# Patient Record
Sex: Male | Born: 1975 | Race: Black or African American | Hispanic: No | Marital: Single | Smoking: Never smoker
Health system: Southern US, Community
[De-identification: ages and names within clinical notes are randomized; demographics above are authoritative.]

## PROBLEM LIST (undated history)

## (undated) DIAGNOSIS — R002 Palpitations: Secondary | ICD-10-CM

## (undated) DIAGNOSIS — R7989 Other specified abnormal findings of blood chemistry: Secondary | ICD-10-CM

## (undated) DIAGNOSIS — G4730 Sleep apnea, unspecified: Secondary | ICD-10-CM

## (undated) DIAGNOSIS — E7800 Pure hypercholesterolemia, unspecified: Secondary | ICD-10-CM

## (undated) DIAGNOSIS — I10 Essential (primary) hypertension: Secondary | ICD-10-CM

## (undated) HISTORY — DX: Sleep apnea, unspecified: G47.30

## (undated) HISTORY — DX: Essential (primary) hypertension: I10

---

## 2004-10-09 ENCOUNTER — Emergency Department (HOSPITAL_COMMUNITY): Admission: EM | Admit: 2004-10-09 | Discharge: 2004-10-09 | Payer: Self-pay | Admitting: *Deleted

## 2006-06-07 IMAGING — CR DG CHEST 1V PORT
1 series · 1 of 1 positions shown · non-contrast
Comparison: None.

CLINICAL DATA: Chest pain/shortness of breath.
 PORTABLE GNFQS-W VIEW:

[view not recorded]
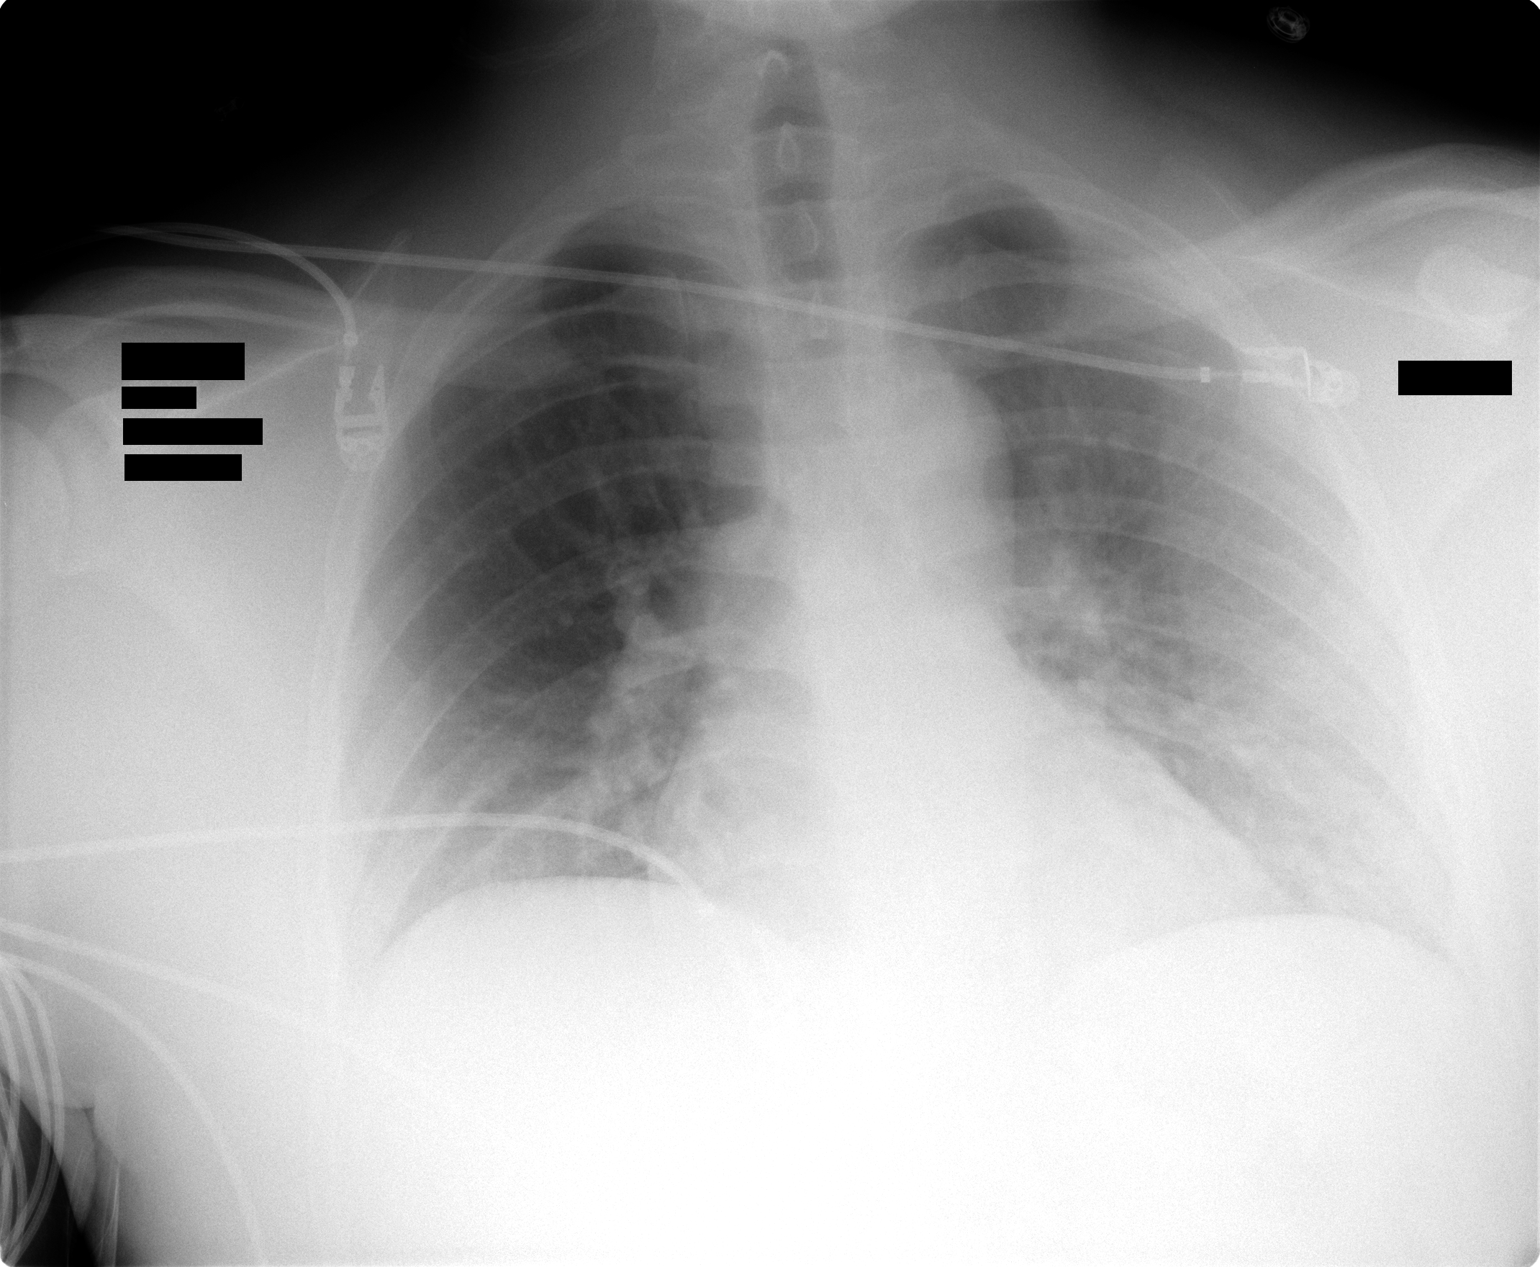

[1 of 1 positions shown; findings below may reference images not displayed]

FINDINGS: Relative low level of inspiration.  Heart and lungs within normal limits considering. No osseous lesions in one view.
IMPRESSION: Suboptimal inspiration ? no active disease.

## 2014-07-19 ENCOUNTER — Encounter: Payer: Self-pay | Admitting: Dietician

## 2014-07-19 ENCOUNTER — Encounter: Payer: 59 | Attending: Internal Medicine | Admitting: Dietician

## 2014-07-19 VITALS — Ht 73.0 in | Wt 349.1 lb

## 2014-07-19 DIAGNOSIS — Z713 Dietary counseling and surveillance: Secondary | ICD-10-CM | POA: Insufficient documentation

## 2014-07-19 DIAGNOSIS — E669 Obesity, unspecified: Secondary | ICD-10-CM | POA: Diagnosis present

## 2014-07-19 DIAGNOSIS — Z6841 Body Mass Index (BMI) 40.0 and over, adult: Secondary | ICD-10-CM | POA: Insufficient documentation

## 2014-07-19 NOTE — Patient Instructions (Addendum)
Aim to eat 3 meals per day and 2 snack if you are hungry. For breakfast have a protein (egg, yogurt, peanut butter, protein powder/shake) with carbohydrate (fruit, bread, oatmeal, cereal). Aim to fill half of your plate with vegetables at lunch and dinner. Limit starch to a quarter of your plate and have protein the size of the palm of your hand. Try using a small plate to help with portions. Have snacks with protein and carbs (see list). Portion out snacks out.  Try Diet V8 Splash or Diet Cranberry juice to help cut back on sugar. Continue your exercise plan.

## 2014-07-19 NOTE — Progress Notes (Signed)
  Medical Nutrition Therapy:  Appt start time: 0815 end time:  0855.   Assessment:  Primary concerns today: Noah Shepherd is here today since he was referred for hypertension. Is interested in learning to eat healthier. Works out regularly. Would like to get weight down to 275 lbs. Has lost weight before by dieting and exercise. Has recently cut out fried foods, eating more vegetables, and only eating chicken and fish for protein for the past 6 weeks. Has lost 12 lbs since then.  Works for [employer] regular business hours from home. Lives by himself and does his own meal preparation. Misses about 7 meals per week. Eats out about 3 x week.  Would like to have help with breakfast meal, adding more vegetables, and what do if he doesn't have time to eat.    Feels like portion sizes "could be better". Sometimes feels hungry but it is rare.   Preferred Learning Style:   No preference indicated   Learning Readiness:   Ready  MEDICATIONS: see list   DIETARY INTAKE:  Usual eating pattern includes 2 meals and 1 snacks per day.  Avoided foods include brussels sprouts  24-hr recall:  B ( AM): skips, might have orange juice, sometimes might have cereal or yogurt Snk ( AM): none  L ( PM): salad with grilled chicken/crab or leftovers Snk ( PM): trail mix or smart pop popcorn D ( PM): chicken, rice, and hibatchi vegetables Snk ( PM): none Beverages: 4-5 8 oz glasses juice, water  Usual physical activity: gym 3 x week - treadmill, bike and weights for 60-90 minutes   Estimated energy needs: 2400 calories 270 g carbohydrates 180 g protein 67 g fat  Progress Towards Goal(s):  In progress.   Nutritional Diagnosis:  [place]-3.3 Overweight/obesity As related to hx of large portion sizes, meal skipping, and consumption of sugar sweetened beverages.  As evidenced by BMI of 46.1.    Intervention:  Nutrition counseling provided. Plan: Aim to eat 3 meals per day and 2 snack if you are  hungry. For breakfast have a protein (egg, yogurt, peanut butter, protein powder/shake) with carbohydrate (fruit, bread, oatmeal, cereal). Aim to fill half of your plate with vegetables at lunch and dinner. Limit starch to a quarter of your plate and have protein the size of the palm of your hand. Try using a small plate to help with portions. Have snacks with protein and carbs (see list). Portion out snacks out.  Try Diet V8 Splash or Diet Cranberry juice to help cut back on sugar. Continue your exercise plan.   Teaching Method Utilized:  Visual Auditory Hands on  Handouts given during visit include:  MyPlate Handout  Yellow Meal Card  15 g CHO Snacks  Protein Shakes  Barriers to learning/adherence to lifestyle change: none  Demonstrated degree of understanding via:  Teach Back   Monitoring/Evaluation:  Dietary intake, exercise, and body weight in 1 month(s).

## 2014-08-29 ENCOUNTER — Encounter: Payer: 59 | Attending: Internal Medicine | Admitting: Dietician

## 2014-08-29 ENCOUNTER — Encounter: Payer: Self-pay | Admitting: Dietician

## 2014-08-29 VITALS — Ht 73.0 in | Wt 343.2 lb

## 2014-08-29 DIAGNOSIS — E669 Obesity, unspecified: Secondary | ICD-10-CM

## 2014-08-29 DIAGNOSIS — Z6841 Body Mass Index (BMI) 40.0 and over, adult: Secondary | ICD-10-CM | POA: Diagnosis not present

## 2014-08-29 DIAGNOSIS — Z713 Dietary counseling and surveillance: Secondary | ICD-10-CM | POA: Insufficient documentation

## 2014-08-29 NOTE — Patient Instructions (Signed)
Continue to eat 3 meals per day and 2 snack if you are hungry. Continue to fill half of your plate with vegetables at lunch and dinner. Continue to limit starch to a quarter of your plate and have protein the size of the palm of your hand. Portion out snacks out or use pre-packaged snacks. Continue your exercise plan.  Working on consistency with your plan. Plan to have dessert 1 x week.

## 2014-08-29 NOTE — Progress Notes (Signed)
  Medical Nutrition Therapy:  Appt start time: 0515 end time:  0530.   Assessment:  Primary concerns today: Noah Shepherd returns with a 6 lbs weight loss. Still feels like he is craving sugar. Drinking diet cranberry juice instead of regular juice. Eating breakfast now. Portion sizes are better and bought smaller plates.   Feeling more hungry, especially in the morning.   Preferred Learning Style:   No preference indicated   Learning Readiness:   Ready  MEDICATIONS: see list   DIETARY INTAKE:  Usual eating pattern includes 2 meals and 1 snacks per day.  Avoided foods include brussels sprouts  24-hr recall:  B ( AM): 2 boiled eggs or cereal/oatmeal Snk ( AM): fruit  L ( PM): salad with grilled chicken/crab or leftovers Snk ( PM): trail mix or smart pop popcorn D ( PM): chicken, rice, and hibatchi vegetables Snk ( PM): none or sweets on weekend nights Beverages: 4-5 8 oz diet cranberry juice glasses juice, water, alcohol sometimes   Usual physical activity: gym 3 x week - treadmill, bike and weights for 60-90 minutes   Estimated energy needs: 2400 calories 270 g carbohydrates 180 g protein 67 g fat  Progress Towards Goal(s):  In progress.   Nutritional Diagnosis:  [place]-3.3 Overweight/obesity As related to hx of large portion sizes, meal skipping, and consumption of sugar sweetened beverages.  As evidenced by BMI of 46.1.    Intervention:  Nutrition counseling provided. Plan: Continue to eat 3 meals per day and 2 snack if you are hungry. Continue to fill half of your plate with vegetables at lunch and dinner. Continue to limit starch to a quarter of your plate and have protein the size of the palm of your hand. Portion out snacks out or use pre-packaged snacks. Continue your exercise plan.  Working on consistency with your plan. Plan to have dessert 1 x week.   Teaching Method Utilized:  Visual Auditory Hands on  Barriers to learning/adherence to lifestyle change:  none  Demonstrated degree of understanding via:  Teach Back   Monitoring/Evaluation:  Dietary intake, exercise, and body weight prn.

## 2017-10-21 DIAGNOSIS — Z23 Encounter for immunization: Secondary | ICD-10-CM | POA: Diagnosis not present

## 2017-10-21 DIAGNOSIS — I10 Essential (primary) hypertension: Secondary | ICD-10-CM

## 2017-10-21 DIAGNOSIS — Z0000 Encounter for general adult medical examination without abnormal findings: Secondary | ICD-10-CM

## 2017-10-21 DIAGNOSIS — G252 Other specified forms of tremor: Secondary | ICD-10-CM

## 2017-12-02 ENCOUNTER — Encounter (HOSPITAL_BASED_OUTPATIENT_CLINIC_OR_DEPARTMENT_OTHER): Payer: Self-pay | Admitting: *Deleted

## 2017-12-02 ENCOUNTER — Emergency Department (HOSPITAL_BASED_OUTPATIENT_CLINIC_OR_DEPARTMENT_OTHER)
Admission: EM | Admit: 2017-12-02 | Discharge: 2017-12-02 | Disposition: A | Discharge status: Home / Self Care | Payer: 59 | Attending: Emergency Medicine | Admitting: Emergency Medicine

## 2017-12-02 ENCOUNTER — Other Ambulatory Visit: Payer: Self-pay

## 2017-12-02 ENCOUNTER — Emergency Department (HOSPITAL_BASED_OUTPATIENT_CLINIC_OR_DEPARTMENT_OTHER): Payer: 59

## 2017-12-02 DIAGNOSIS — Z7982 Long term (current) use of aspirin: Secondary | ICD-10-CM | POA: Diagnosis not present

## 2017-12-02 DIAGNOSIS — R002 Palpitations: Secondary | ICD-10-CM

## 2017-12-02 DIAGNOSIS — I4891 Unspecified atrial fibrillation: Secondary | ICD-10-CM | POA: Insufficient documentation

## 2017-12-02 DIAGNOSIS — I10 Essential (primary) hypertension: Secondary | ICD-10-CM | POA: Diagnosis not present

## 2017-12-02 DIAGNOSIS — R079 Chest pain, unspecified: Secondary | ICD-10-CM | POA: Diagnosis present

## 2017-12-02 DIAGNOSIS — Z79899 Other long term (current) drug therapy: Secondary | ICD-10-CM | POA: Insufficient documentation

## 2017-12-02 HISTORY — DX: Palpitations: R00.2

## 2017-12-02 HISTORY — DX: Other specified abnormal findings of blood chemistry: R79.89

## 2017-12-02 HISTORY — DX: Pure hypercholesterolemia, unspecified: E78.00

## 2017-12-02 LAB — BASIC METABOLIC PANEL
Anion gap: 8 (ref 5–15)
BUN: 7 mg/dL (ref 6–20)
CALCIUM: 8.9 mg/dL (ref 8.9–10.3)
CO2: 23 mmol/L (ref 22–32)
CREATININE: 1.07 mg/dL (ref 0.61–1.24)
Chloride: 106 mmol/L (ref 98–111)
GFR calc non Af Amer: >60 mL/min (ref >60)
Glucose, Bld: 122 mg/dL — ABNORMAL HIGH (ref 70–99)
Potassium: 3.6 mmol/L (ref 3.5–5.1)
SODIUM: 137 mmol/L (ref 135–145)

## 2017-12-02 LAB — CBC
HCT: 54.4 % — ABNORMAL HIGH (ref 39.0–52.0)
Hemoglobin: 17.8 g/dL — ABNORMAL HIGH (ref 13.0–17.0)
MCH: 30.5 pg (ref 26.0–34.0)
MCHC: 32.7 g/dL (ref 30.0–36.0)
MCV: 93.3 fL (ref 80.0–100.0)
NRBC: 0 % (ref 0.0–0.2)
PLATELETS: 207 10*3/uL (ref 150–400)
RBC: 5.83 MIL/uL — ABNORMAL HIGH (ref 4.22–5.81)
RDW: 13.3 % (ref 11.5–15.5)
WBC: 8.2 10*3/uL (ref 4.0–10.5)

## 2017-12-02 LAB — TROPONIN I: Troponin I: <0.03 ng/mL (ref <0.03)

## 2017-12-02 LAB — MAGNESIUM: MAGNESIUM: 2 mg/dL (ref 1.7–2.4)

## 2017-12-02 MED ORDER — ETOMIDATE 2 MG/ML IV SOLN: 10.0000 mg | Freq: Once | INTRAVENOUS | Status: DC

## 2017-12-02 MED ORDER — SODIUM CHLORIDE 0.9 % IV SOLN: INTRAVENOUS | Status: DC

## 2017-12-02 MED ORDER — APIXABAN 2.5 MG PO TABS
5.0000 mg | ORAL_TABLET | Freq: Two times a day (BID) | ORAL | Status: DC
  Administered 2017-12-02: 5 mg via ORAL
  Filled 2017-12-02: qty 2

## 2017-12-02 MED ORDER — APIXABAN 5 MG PO TABS: 5.0000 mg | ORAL_TABLET | Freq: Two times a day (BID) | ORAL | 0 refills | Status: DC

## 2017-12-02 NOTE — ED Provider Notes (Signed)
[facility] EMERGENCY DEPARTMENT Provider Note   CSN: [id number] Arrival date & time: 12/02/17  1158     History   Chief Complaint Chief Complaint  Patient presents with  . Chest Pain    HPI Noah Shepherd is a 42 y.o. male.  The history is provided by medical records, the patient and a parent. No language interpreter was used.  Palpitations   This is a recurrent problem. The current episode started yesterday. The problem occurs constantly. The problem has not changed since onset.Associated with: took sudafed 30 min before onset. On average, each episode lasts 1 day. Associated symptoms include irregular heartbeat and cough (dry with congestion). Pertinent negatives include no diaphoresis, no fever, no malaise/fatigue, no numbness, no chest pain, no chest pressure, no claudication, no exertional chest pressure, no orthopnea, no syncope, no abdominal pain, no nausea, no back pain, no lower extremity edema, no dizziness, no hemoptysis, no shortness of breath and no sputum production. He has tried nothing for the symptoms. Risk factors include obesity.    Past Medical History:  Diagnosis Date  . High cholesterol   . Hypertension   . Low testosterone   . Palpitations     There are no active problems to display for this patient.   History reviewed. No pertinent surgical history.      Home Medications    Prior to Admission medications   Medication Sig Start Date End Date Taking? Authorizing Provider  aspirin 81 MG tablet Take 81 mg by mouth daily.   Yes [provider]  Nebivolol HCl (BYSTOLIC PO) Take by mouth.   Yes [provider]  pantoprazole (PROTONIX) 40 MG tablet Take 40 mg by mouth daily.   Yes [provider]  Rosuvastatin Calcium (CRESTOR PO) Take by mouth.   Yes [provider]  testosterone enanthate (DELATESTRYL) 200 MG/ML injection Inject into the muscle every 14 (fourteen) days. For IM use only   Yes [provider]    Family History No family history on file.  Social History Social History   Tobacco Use  . Smoking status: Never Smoker  . Smokeless tobacco: Never Used  Substance Use Topics  . Alcohol use: Not Currently  . Drug use: Never     Allergies   Patient has no known allergies.   Review of Systems Review of Systems  Constitutional: Negative for chills, diaphoresis, fatigue, fever and malaise/fatigue.  HENT: Positive for congestion.   Eyes: Negative for visual disturbance.  Respiratory: Positive for cough (dry with congestion). Negative for hemoptysis, sputum production, choking, chest tightness, shortness of breath, wheezing and stridor.   Cardiovascular: Positive for palpitations. Negative for chest pain, orthopnea, claudication, leg swelling and syncope.  Gastrointestinal: Negative for abdominal pain and nausea.  Genitourinary: Negative for flank pain.  Musculoskeletal: Negative for back pain, neck pain and neck stiffness.  Skin: Negative for rash and wound.  Neurological: Negative for dizziness, light-headedness and numbness.  Psychiatric/Behavioral: Negative for agitation.  All other systems reviewed and are negative.    Physical Exam Updated Vital Signs BP (!) 151/93   Pulse 88   Temp 98.8 F (37.1 C) (Oral)   Resp 20   Ht 6\' 1"  (1.854 m)   Wt (!) 168.4 kg   SpO2 96%   BMI 48.97 kg/m   Physical Exam  Constitutional: He appears well-developed and well-nourished.  Non-toxic appearance. He does not appear ill. No distress.  HENT:  Head: Normocephalic and atraumatic.  Nose: Nose normal.  Mouth/Throat: Oropharynx is clear and moist. No oropharyngeal exudate.  Eyes: Pupils are equal, round, and reactive to light. Conjunctivae and EOM are normal.  Neck: Neck supple.  Cardiovascular: Normal rate. An irregularly irregular rhythm present.  No murmur heard. Pulmonary/Chest: Effort normal and breath sounds normal. No respiratory distress. He has no  wheezes. He has no rales. He exhibits no tenderness.  Abdominal: Soft. There is no tenderness.  Musculoskeletal: He exhibits no edema or tenderness.  Neurological: He is alert. No sensory deficit. He exhibits normal muscle tone.  Skin: Skin is warm and dry. Capillary refill takes less than 2 seconds. He is not diaphoretic. No erythema. No pallor.  Psychiatric: He has a normal mood and affect.  Nursing note and vitals reviewed.    ED Treatments / Results  Labs (all labs ordered are listed, but only abnormal results are displayed) Labs Reviewed  BASIC METABOLIC PANEL - Abnormal; Notable for the following components:      Result Value   Glucose, Bld 122 (*)    All other components within normal limits  CBC - Abnormal; Notable for the following components:   RBC 5.83 (*)    Hemoglobin 17.8 (*)    HCT 54.4 (*)    All other components within normal limits  TROPONIN I  MAGNESIUM    EKG EKG Interpretation  Date/Time:  Tuesday December 02 2017 12:05:08 EDT Ventricular Rate:  92 PR Interval:    QRS Duration: 90 QT Interval:  334 QTC Calculation: 413 R Axis:   15 Text Interpretation:  Atrial fibrillation Abnormal ECG No prior ECG for comparison. New Afib vs A flutter.  No STEMI Confirmed by Theda Belfast ([id number]) on 12/02/2017 12:12:20 PM   Radiology Dg Chest 2 View  Result Date: 12/02/2017 CLINICAL DATA:  Chest pain EXAM: CHEST - 2 VIEW COMPARISON:  None. FINDINGS: Normal heart size. Normal mediastinal contour. No pneumothorax. No pleural effusion. Lungs appear clear, with no acute consolidative airspace disease and no pulmonary edema. IMPRESSION: No active cardiopulmonary disease. Electronically Signed   By: Delbert Phenix M.D.   On: 12/02/2017 12:26    Procedures Procedures (including critical care time)  CHA2Ds2-VASc Score for Atrial Fibrillation    Patient Score  Age <65 = 0 65-74 = 1 > 75 = 2 0  Sex Male = 0 Male = 1 0  CHF History No = 0  Yes = 1 0  HTN History  No = 0  Yes = 1 1  Stroke/TIA/TE History No = 0  Yes = 1 0  Vascular Disease History No = 0  Yes = 1 0  Diabetes History No = 0  Yes = 1 0  Total:  1   0.6 % stroke rate/year from a score of 1   Medications Ordered in ED Medications  0.9 %  sodium chloride infusion (has no administration in time range)  apixaban (ELIQUIS) tablet 5 mg (5 mg Oral Given 12/02/17 1516)     Initial Impression / Assessment and Plan / ED Course  I have reviewed the triage vital signs and the nursing notes.  Pertinent labs & imaging results that were available during my care of the patient were reviewed by me and considered in my medical decision making (see chart for details).     Noah Shepherd is a 42 y.o. male with a past medical history significant for hypertension, hypercholesteremia, low testosterone, and prior palpitations who presents with palpitations.  Patient reports that he took Sudafed last night for  several days of congestion.  He reports possibly 30 minutes after ingestion he started having palpitations.  He reports they were feeling fast and slow at times but he has had persistent palpation since that time.  He denies any chest pain, shortness of breath or lightheadedness.  He denies any recent trauma.  No other medication changes.  No nausea, vomiting, diaphoresis, abdominal pain.  He denies any leg pain or leg swelling.    On exam, patient had a regular pulse on palpation.  Patient had no murmur.  Lungs were clear and chest was nontender.  Abdomen was nontender.  EKG revealed atrial fibrillation versus atrial flutter.  Rate was in the 90s.  Patient had no RVR or evidence of STEMI on EKG.  As I suspect this was provoked by the patient Sudafed and his known onset yesterday evening, I anticipate patient will be a candidate for ED electrocardioversion.  Patient will screen laboratory testing and will touch base with cardiology initially.  Potassium was normal.  Will check troponin and  magnesium.    Anticipate ED cardioversion and follow-up with A. fib clinic.  Chads vas score was a 1.   2:37 PM Magnesium was normal.  Troponin was normal.  Cardiology will be called to discuss electrical cardioversion in the ED.  3:10 PM Cardiology agreed with work-up and agreed with electrocardioversion in the emergency department.  They recommend initiation of Eliquis for the next 3 weeks 5 mg twice a day and follow-up in the A. fib clinic.    As patient was getting set up for ED cardioversion, patient spontaneous converted.  Patient has remained in sinus rhythm.  Patient was p.o. challenged and ambulated.  Patient remained symptom-free without palpitations and in sinus rhythm.  Patient was felt to be stable for discharge home for A. fib clinic follow-up and PCP follow-up.  Patient instructed to stay hydrated and take the blood thinners.  Patient understood return precautions for new or worsened symptoms.  Patient no other questions or concerns and was discharged in good condition.   Final Clinical Impressions(s) / ED Diagnoses   Final diagnoses:  Palpitations  Atrial fibrillation, unspecified type [facility])    ED Discharge Orders         Ordered    Amb referral to AFIB Clinic     12/02/17 1320    apixaban (ELIQUIS) 5 MG TABS tablet  2 times daily     12/02/17 1519          Clinical Impression: 1. Palpitations   2. Atrial fibrillation, unspecified type (HCC)     Disposition: Discharge  Condition: Good  I have discussed the results, Dx and Tx plan with the pt(& family if present). He/she/they expressed understanding and agree(s) with the plan. Discharge instructions discussed at great length. Strict return precautions discussed and pt &/or family have verbalized understanding of the instructions. No further questions at time of discharge.    New Prescriptions   APIXABAN (ELIQUIS) 5 MG TABS TABLET    Take 1 tablet (5 mg total) by mouth 2 (two) times daily.    Follow  Up: [facility] [address] [id number] mc [place] [address] [phone]    [facility] [address] [id number] mc [address] [place] [address] [phone]    Dorothyann Peng, MD [address] [place] [address] [phone]        Dashley Monts, Canary Brim, MD 12/02/17 [phone]

## 2017-12-02 NOTE — Discharge Instructions (Addendum)
Your EKG today showed evidence of atrial fibrillation, likely secondary to taking the Sudafed last night.  As we discussed cardioversion, you converted back to sinus rhythm.  At the recognition of cardiology please take the blood thinners for the next 3 weeks and follow-up with the A. fib clinic.  Please call to schedule that appointment.  If you have any return of symptoms or any new or worsened symptoms, please return to the nearest emergency department.  Please stay hydrated.  Please avoid stimulants and Sudafed until cleared by the cardiology team.   Information on my medicine - ELIQUIS (apixaban)  This medication education was reviewed with me or my healthcare representative as part of my discharge preparation.  The pharmacist that spoke with me during my hospital stay was:  Daylene Posey, [facility]  WHY WAS ELIQUIS PRESCRIBED FOR YOU? Eliquis was prescribed for you to reduce the risk of forming blood clots that can cause a stroke if you have a medical condition called atrial fibrillation (a type of irregular heartbeat) OR to reduce the risk of a blood clots forming after orthopedic surgery.  WHAT DO YOU NEED TO KNOW ABOUT ELIQUIS ? Take your Eliquis TWICE DAILY - one tablet in the morning and one tablet in the evening with or without food.  It would be best to take the doses about the same time each day.  If you have difficulty swallowing the tablet whole please discuss with your pharmacist how to take the medication safely.  Take Eliquis exactly as prescribed by your doctor and DO NOT stop taking Eliquis without talking to the doctor who prescribed the medication.  Stopping may increase your risk of developing a new clot or stroke.  Refill your prescription before you run out.  After discharge, you should have regular check-up appointments with your healthcare provider that is prescribing your Eliquis.  In the future your dose may need to be changed if your kidney function or weight  changes by a significant amount or as you get older.  WHAT DO YOU DO IF YOU MISS A DOSE? If you miss a dose, take it as soon as you remember on the same day and resume taking twice daily.  Do not take more than one dose of ELIQUIS at the same time.  IMPORTANT SAFETY INFORMATION A possible side effect of Eliquis is bleeding. You should call your healthcare provider right away if you experience any of the following: Bleeding from an injury or your nose that does not stop. Unusual colored urine (red or dark brown) or unusual colored stools (red or black). Unusual bruising for unknown reasons. A serious fall or if you hit your head (even if there is no bleeding).  Some medicines may interact with Eliquis and might increase your risk of bleeding or clotting while on Eliquis. To help avoid this, consult your healthcare provider or pharmacist prior to using any new prescription or non-prescription medications, including herbals, vitamins, non-steroidal anti-inflammatory drugs (NSAIDs) and supplements.  This website has more information on Eliquis (apixaban): [email or web address].com.cy.

## 2017-12-02 NOTE — ED Triage Notes (Signed)
Chest pain since last night. Palpitations. Hx of Palpitations.

## 2017-12-02 NOTE — ED Notes (Signed)
Pt tolerated PO fluids. Walked around department with no c/o chest pain, SOB, or palpitations. Pt remains in NSR.

## 2017-12-02 NOTE — ED Notes (Signed)
Pt converted back to NSR. MD aware.

## 2017-12-03 ENCOUNTER — Encounter: Payer: Self-pay | Admitting: Dietician

## 2017-12-12 ENCOUNTER — Ambulatory Visit (HOSPITAL_COMMUNITY)
Admission: RE | Admit: 2017-12-12 | Discharge: 2017-12-12 | Disposition: A | Discharge status: Home / Self Care | Payer: 59 | Source: Ambulatory Visit | Attending: Nurse Practitioner | Admitting: Nurse Practitioner

## 2017-12-12 ENCOUNTER — Encounter (HOSPITAL_COMMUNITY): Payer: Self-pay | Admitting: Nurse Practitioner

## 2017-12-12 ENCOUNTER — Other Ambulatory Visit: Payer: Self-pay

## 2017-12-12 VITALS — BP 126/84 | HR 79 | Ht 73.0 in | Wt 374.0 lb

## 2017-12-12 DIAGNOSIS — E7800 Pure hypercholesterolemia, unspecified: Secondary | ICD-10-CM | POA: Diagnosis not present

## 2017-12-12 DIAGNOSIS — I10 Essential (primary) hypertension: Secondary | ICD-10-CM | POA: Insufficient documentation

## 2017-12-12 DIAGNOSIS — Z7982 Long term (current) use of aspirin: Secondary | ICD-10-CM | POA: Diagnosis not present

## 2017-12-12 DIAGNOSIS — I480 Paroxysmal atrial fibrillation: Secondary | ICD-10-CM | POA: Diagnosis not present

## 2017-12-12 DIAGNOSIS — Z7901 Long term (current) use of anticoagulants: Secondary | ICD-10-CM | POA: Diagnosis not present

## 2017-12-12 DIAGNOSIS — Z79899 Other long term (current) drug therapy: Secondary | ICD-10-CM | POA: Diagnosis not present

## 2017-12-12 DIAGNOSIS — I4891 Unspecified atrial fibrillation: Secondary | ICD-10-CM | POA: Insufficient documentation

## 2017-12-12 DIAGNOSIS — G4730 Sleep apnea, unspecified: Secondary | ICD-10-CM | POA: Insufficient documentation

## 2017-12-12 MED ORDER — DILTIAZEM HCL 30 MG PO TABS: ORAL_TABLET | ORAL | 1 refills | Status: DC

## 2017-12-12 NOTE — Patient Instructions (Signed)
Your physician has recommended you make the following change in your medication:  1)Cardizem 30mg -- take 1 tablet every 4 hours AS NEEDED for AFIB heart rate over 100. 

## 2017-12-12 NOTE — Progress Notes (Signed)
Primary Care Physician: Dorothyann Peng, MD Referring Physician: [facility] ER f/u   Noah Shepherd is a 42 y.o. male with a h/o HTN, low testosterone, treated sleep apnea,using cpap that is in the afib clinic for f/u. He took a sudafed for some sinus concerns and noted an irregular, fast heartbeat around 11 pm. When it continued into the next day, he presented to the ER. EKG did show new onset afib in the 90's. He was successfully cardioverted.  He denies any tobacco use, any alcohol or street drugs. He does use cpap religiously. He is morbidly obese and currently without an exercise program.  Today, he denies symptoms of palpitations, chest pain, shortness of breath, orthopnea, PND, lower extremity edema, dizziness, presyncope, syncope, or neurologic sequela. The patient is tolerating medications without difficulties and is otherwise without complaint today.   Past Medical History:  Diagnosis Date  . High cholesterol   . Hypertension   . Low testosterone   . Palpitations   . Sleep apnea    No past surgical history on file.  Current Outpatient Medications  Medication Sig Dispense Refill  . apixaban (ELIQUIS) 5 MG TABS tablet Take 1 tablet (5 mg total) by mouth 2 (two) times daily. 60 tablet 0  . cetirizine (ZYRTEC) 10 MG tablet Take 10 mg by mouth daily.    . nebivolol (BYSTOLIC) 10 MG tablet Take 10 mg by mouth daily.    . pantoprazole (PROTONIX) 40 MG tablet Take 40 mg by mouth daily.    . rosuvastatin (CRESTOR) 20 MG tablet Take 20 mg by mouth daily.    Marland Kitchen SYNTHROID 50 MCG tablet Take 50 mcg by mouth daily.  0  . testosterone enanthate (DELATESTRYL) 200 MG/ML injection Inject into the muscle every 14 (fourteen) days. For IM use only    . aspirin 81 MG tablet Take 81 mg by mouth daily.    Marland Kitchen diltiazem (CARDIZEM) 30 MG tablet Take 1 tablet every 4 hours AS NEEDED for AFIB heart rate over 100 45 tablet 1   No current facility-administered medications for this encounter.     No  Known Allergies  Social History   Socioeconomic History  . Marital status: Single    Spouse name: Not on file  . Number of children: Not on file  . Years of education: Not on file  . Highest education level: Not on file  Occupational History  . Not on file  Social Needs  . Financial resource strain: Not on file  . Food insecurity:    Worry: Not on file    Inability: Not on file  . Transportation needs:    Medical: Not on file    Non-medical: Not on file  Tobacco Use  . Smoking status: Never Smoker  . Smokeless tobacco: Never Used  Substance and Sexual Activity  . Alcohol use: Not Currently  . Drug use: Never  . Sexual activity: Not on file  Lifestyle  . Physical activity:    Days per week: Not on file    Minutes per session: Not on file  . Stress: Not on file  Relationships  . Social connections:    Talks on phone: Not on file    Gets together: Not on file    Attends religious service: Not on file    Active member of club or organization: Not on file    Attends meetings of clubs or organizations: Not on file    Relationship status: Not on file  . Intimate  partner violence:    Fear of current or ex partner: Not on file    Emotionally abused: Not on file    Physically abused: Not on file    Forced sexual activity: Not on file  Other Topics Concern  . Not on file  Social History Narrative   ** Merged History Encounter **        No family history on file.  ROS- All systems are reviewed and negative except as per the HPI above  Physical Exam: Vitals:   12/12/17 0920  BP: 126/84  Pulse: 79  Weight: (!) 169.6 kg  Height: 6\' 1"  (1.854 m)   Wt Readings from Last 3 Encounters:  12/12/17 (!) 169.6 kg  12/02/17 (!) 168.4 kg  08/29/14 (!) 155.7 kg    Labs: Lab Results  Component Value Date   NA 137 12/02/2017   K 3.6 12/02/2017   CL 106 12/02/2017   CO2 23 12/02/2017   GLUCOSE 122 (H) 12/02/2017   BUN 7 12/02/2017   CREATININE 1.07 12/02/2017    CALCIUM 8.9 12/02/2017   MG 2.0 12/02/2017   No results found for: INR No results found for: CHOL, HDL, LDLCALC, TRIG   GEN- The patient is well appearing, alert and oriented x 3 today.   Head- normocephalic, atraumatic Eyes-  Sclera clear, conjunctiva pink Ears- hearing intact Oropharynx- clear Neck- supple, no JVP Lymph- no cervical lymphadenopathy Lungs- Clear to ausculation bilaterally, normal work of breathing Heart- Regular rate and rhythm, no murmurs, rubs or gallops, PMI not laterally displaced GI- soft, NT, ND, + BS Extremities- no clubbing, cyanosis, or edema MS- no significant deformity or atrophy Skin- no rash or lesion Psych- euthymic mood, full affect Neuro- strength and sensation are intact  EKG-NSR at 79 bpm, Pr jtn 155 ms, qrs int 90 ms, qtc 415 ms Epic records reviewed   Assessment and Plan: 1. New onset afib Successful cardioversion in the ER General edcuation re afib and triggers discussed Continue nebivolol 10 mg qd  Echo Continue to use cpap Avoid decongestants Will prescribe 30 mg Cardizem to use as needed if breakthrough afib should return  2. Chadsvasc score of 1(htn) Continue eliquis 5 mg bid for 4 weeks after cardioversion and can then stop Can stop asa while on eliquis Bleeding precautions discussed  3.HTN Stable  4. BMI of 49.34 Weight loss and exercise encouraged   F/u will depend on results of echo, which will be discussed with pt when report received  Lupita Leash [facility] [address] [place], [place] [address] [phone]

## 2017-12-16 ENCOUNTER — Ambulatory Visit (INDEPENDENT_AMBULATORY_CARE_PROVIDER_SITE_OTHER): Payer: 59 | Admitting: Internal Medicine

## 2017-12-16 ENCOUNTER — Encounter: Payer: Self-pay | Admitting: Internal Medicine

## 2017-12-16 VITALS — BP 162/98 | HR 79 | Temp 98.8°F | Ht 73.0 in | Wt 373.4 lb

## 2017-12-16 DIAGNOSIS — Z7982 Long term (current) use of aspirin: Secondary | ICD-10-CM

## 2017-12-16 DIAGNOSIS — Z09 Encounter for follow-up examination after completed treatment for conditions other than malignant neoplasm: Secondary | ICD-10-CM

## 2017-12-16 DIAGNOSIS — Z712 Person consulting for explanation of examination or test findings: Secondary | ICD-10-CM

## 2017-12-16 DIAGNOSIS — I119 Hypertensive heart disease without heart failure: Secondary | ICD-10-CM

## 2017-12-16 DIAGNOSIS — I480 Paroxysmal atrial fibrillation: Secondary | ICD-10-CM | POA: Diagnosis not present

## 2017-12-16 DIAGNOSIS — G4733 Obstructive sleep apnea (adult) (pediatric): Secondary | ICD-10-CM

## 2017-12-16 DIAGNOSIS — Z6841 Body Mass Index (BMI) 40.0 and over, adult: Secondary | ICD-10-CM

## 2017-12-16 NOTE — Progress Notes (Signed)
Subjective:     Patient ID: Noah Shepherd , male    DOB: February 22, 1975 , 42 y.o.   MRN: [id number]   Chief Complaint  Patient presents with  . ER f/u    HPI  He is here today for ER follow-up. He presented to ER on 10/22 w/ complaint of palpitations. He was found to be in afib with RVR. He was scheduled for cardioversion and he returned to NSR prior to procedure. He was discharged home on Eliquis. He has not had any further sx since his discharge. He denies cp, and sob.     Past Medical History:  Diagnosis Date  . High cholesterol   . Hypertension   . Low testosterone   . Palpitations   . Sleep apnea      Family History  Problem Relation Age of Onset  . Hyperlipidemia Mother   . Hypertension Mother   . Diabetes Mother   . Hyperlipidemia Father   . Hypertension Father   . Diabetes Father   . Colon cancer Other   . Stomach cancer Other   . Heart Problems Other   . Diabetes Other   . Congenital heart disease Other      Current Outpatient Medications:  .  apixaban (ELIQUIS) 5 MG TABS tablet, Take 1 tablet (5 mg total) by mouth 2 (two) times daily., Disp: 60 tablet, Rfl: 0 .  aspirin 81 MG tablet, Take 81 mg by mouth daily., Disp: , Rfl:  .  cetirizine (ZYRTEC) 10 MG tablet, Take 10 mg by mouth daily., Disp: , Rfl:  .  diltiazem (CARDIZEM) 30 MG tablet, Take 1 tablet every 4 hours AS NEEDED for AFIB heart rate over 100, Disp: 45 tablet, Rfl: 1 .  nebivolol (BYSTOLIC) 10 MG tablet, Take 10 mg by mouth daily., Disp: , Rfl:  .  pantoprazole (PROTONIX) 40 MG tablet, Take 40 mg by mouth daily., Disp: , Rfl:  .  rosuvastatin (CRESTOR) 20 MG tablet, Take 20 mg by mouth daily., Disp: , Rfl:  .  SYNTHROID 50 MCG tablet, Take 50 mcg by mouth daily., Disp: , Rfl: 0 .  testosterone enanthate (DELATESTRYL) 200 MG/ML injection, Inject into the muscle every 14 (fourteen) days. For IM use only, Disp: , Rfl:    No Known Allergies   Review of Systems  Constitutional: Negative.    Respiratory: Negative.   Cardiovascular: Positive for palpitations.  Gastrointestinal: Negative.   Neurological: Negative.   Psychiatric/Behavioral: Negative.      Today's Vitals   12/16/17 1205  BP: (!) 162/98  Pulse: 79  Temp: 98.8 F (37.1 C)  TempSrc: Oral  Weight: (!) 373 lb 6.4 oz (169.4 kg)  Height: 6\' 1"  (1.854 m)   Body mass index is 49.26 kg/m.   Objective:  Physical Exam  Constitutional: He is oriented to person, place, and time. He appears well-developed and well-nourished.  obese  HENT:  Head: Normocephalic and atraumatic.  Eyes: EOM are normal.  Cardiovascular: Normal rate, regular rhythm and normal heart sounds.  Pulmonary/Chest: Effort normal and breath sounds normal.  Neurological: He is alert and oriented to person, place, and time.  Psychiatric: He has a normal mood and affect.  Nursing note and vitals reviewed.       Assessment And Plan:     1. Benign hypertensive heart disease without heart failure   Uncontrolled. He admits that he has yet to take one of his meds today. He is encouraged to take meds as prescribed. Also  encouraged to avoid adding salt to his foods and to exercise no less than 150 minutes per week. He will rto in four weeks for re-evaluation. If elevated, I will add another medication to his current regimen, t/c ARB.  2. Paroxysmal A-fib (HCC)  He is currently in sinus rhythm. He is encouraged to continue with Eliquis twice daily. ER records reviewed in full detail.   3. Class 3 severe obesity due to excess calories with serious comorbidity and body mass index (BMI) of 45.0 to 49.9 in adult [facility])  Chronic. He is encouraged to strive for BMI less than 38 to decrease cardiac risk. He is encouraged to incorporate more cardio into his workout routine.   4. OSA (obstructive sleep apnea)  Importance of CPAP compliance was discussed with the patient. He is encouraged to wear for minimum of six hours every night.   Gwynneth Aliment, MD

## 2017-12-22 ENCOUNTER — Encounter: Payer: Self-pay | Admitting: Internal Medicine

## 2018-01-02 ENCOUNTER — Ambulatory Visit (HOSPITAL_COMMUNITY)
Admission: RE | Admit: 2018-01-02 | Discharge: 2018-01-02 | Disposition: A | Discharge status: Home / Self Care | Payer: 59 | Source: Ambulatory Visit | Attending: Nurse Practitioner | Admitting: Nurse Practitioner

## 2018-01-02 DIAGNOSIS — I10 Essential (primary) hypertension: Secondary | ICD-10-CM | POA: Diagnosis not present

## 2018-01-02 DIAGNOSIS — E785 Hyperlipidemia, unspecified: Secondary | ICD-10-CM | POA: Diagnosis not present

## 2018-01-02 DIAGNOSIS — I480 Paroxysmal atrial fibrillation: Secondary | ICD-10-CM | POA: Diagnosis not present

## 2018-01-02 NOTE — Progress Notes (Signed)
2D Echocardiogram has been performed.  Pieter PartridgeBrooke S Valeria Boza 01/02/2018, 3:48 PM

## 2018-01-12 ENCOUNTER — Other Ambulatory Visit (HOSPITAL_COMMUNITY): Payer: Self-pay | Admitting: *Deleted

## 2018-01-12 ENCOUNTER — Encounter (HOSPITAL_COMMUNITY): Payer: Self-pay | Admitting: *Deleted

## 2018-01-12 DIAGNOSIS — I480 Paroxysmal atrial fibrillation: Secondary | ICD-10-CM

## 2018-01-29 ENCOUNTER — Ambulatory Visit (INDEPENDENT_AMBULATORY_CARE_PROVIDER_SITE_OTHER): Payer: 59 | Admitting: Internal Medicine

## 2018-01-29 ENCOUNTER — Encounter: Payer: Self-pay | Admitting: Internal Medicine

## 2018-01-29 VITALS — BP 132/86 | HR 81 | Temp 98.6°F | Ht 73.0 in | Wt 370.8 lb

## 2018-01-29 DIAGNOSIS — I119 Hypertensive heart disease without heart failure: Secondary | ICD-10-CM

## 2018-01-29 DIAGNOSIS — I480 Paroxysmal atrial fibrillation: Secondary | ICD-10-CM | POA: Diagnosis not present

## 2018-01-29 DIAGNOSIS — E039 Hypothyroidism, unspecified: Secondary | ICD-10-CM | POA: Diagnosis not present

## 2018-01-29 DIAGNOSIS — Z6841 Body Mass Index (BMI) 40.0 and over, adult: Secondary | ICD-10-CM

## 2018-01-29 NOTE — Patient Instructions (Signed)
DASH Eating Plan  DASH stands for "Dietary Approaches to Stop Hypertension." The DASH eating plan is a healthy eating plan that has been shown to reduce high blood pressure (hypertension). It may also reduce your risk for type 2 diabetes, heart disease, and stroke. The DASH eating plan may also help with weight loss.  What are tips for following this plan?    General guidelines   Avoid eating more than 2,300 mg (milligrams) of salt (sodium) a day. If you have hypertension, you may need to reduce your sodium intake to 1,500 mg a day.   Limit alcohol intake to no more than 1 drink a day for nonpregnant women and 2 drinks a day for men. One drink equals 12 oz of beer, 5 oz of wine, or 1 oz of hard liquor.   Work with your health care provider to maintain a healthy body weight or to lose weight. Ask what an ideal weight is for you.   Get at least 30 minutes of exercise that causes your heart to beat faster (aerobic exercise) most days of the week. Activities may include walking, swimming, or biking.   Work with your health care provider or diet and nutrition specialist (dietitian) to adjust your eating plan to your individual calorie needs.  Reading food labels     Check food labels for the amount of sodium per serving. Choose foods with less than 5 percent of the Daily Value of sodium. Generally, foods with less than 300 mg of sodium per serving fit into this eating plan.   To find whole grains, look for the word "whole" as the first word in the ingredient list.  Shopping   Buy products labeled as "low-sodium" or "no salt added."   Buy fresh foods. Avoid canned foods and premade or frozen meals.  Cooking   Avoid adding salt when cooking. Use salt-free seasonings or herbs instead of table salt or sea salt. Check with your health care provider or pharmacist before using salt substitutes.   Do not fry foods. Cook foods using healthy methods such as baking, boiling, grilling, and broiling instead.   Cook with  heart-healthy oils, such as olive, canola, soybean, or sunflower oil.  Meal planning   Eat a balanced diet that includes:  ? 5 or more servings of fruits and vegetables each day. At each meal, try to fill half of your plate with fruits and vegetables.  ? Up to 6-8 servings of whole grains each day.  ? Less than 6 oz of lean meat, poultry, or fish each day. A 3-oz serving of meat is about the same size as a deck of cards. One egg equals 1 oz.  ? 2 servings of low-fat dairy each day.  ? A serving of nuts, seeds, or beans 5 times each week.  ? Heart-healthy fats. Healthy fats called Omega-3 fatty acids are found in foods such as flaxseeds and coldwater fish, like sardines, salmon, and mackerel.   Limit how much you eat of the following:  ? Canned or prepackaged foods.  ? Food that is high in trans fat, such as fried foods.  ? Food that is high in saturated fat, such as fatty meat.  ? Sweets, desserts, sugary drinks, and other foods with added sugar.  ? Full-fat dairy products.   Do not salt foods before eating.   Try to eat at least 2 vegetarian meals each week.   Eat more home-cooked food and less restaurant, buffet, and fast food.     When eating at a restaurant, ask that your food be prepared with less salt or no salt, if possible.  What foods are recommended?  The items listed may not be a complete list. Talk with your dietitian about what dietary choices are best for you.  Grains  Whole-grain or whole-wheat bread. Whole-grain or whole-wheat pasta. Brown rice. Oatmeal. Quinoa. Bulgur. Whole-grain and low-sodium cereals. Pita bread. Low-fat, low-sodium crackers. Whole-wheat flour tortillas.  Vegetables  Fresh or frozen vegetables (raw, steamed, roasted, or grilled). Low-sodium or reduced-sodium tomato and vegetable juice. Low-sodium or reduced-sodium tomato sauce and tomato paste. Low-sodium or reduced-sodium canned vegetables.  Fruits  All fresh, dried, or frozen fruit. Canned fruit in natural juice (without  added sugar).  Meat and other protein foods  Skinless chicken or turkey. Ground chicken or turkey. Pork with fat trimmed off. Fish and seafood. Egg whites. Dried beans, peas, or lentils. Unsalted nuts, nut butters, and seeds. Unsalted canned beans. Lean cuts of beef with fat trimmed off. Low-sodium, lean deli meat.  Dairy  Low-fat (1%) or fat-free (skim) milk. Fat-free, low-fat, or reduced-fat cheeses. Nonfat, low-sodium ricotta or cottage cheese. Low-fat or nonfat yogurt. Low-fat, low-sodium cheese.  Fats and oils  Soft margarine without trans fats. Vegetable oil. Low-fat, reduced-fat, or light mayonnaise and salad dressings (reduced-sodium). Canola, safflower, olive, soybean, and sunflower oils. Avocado.  Seasoning and other foods  Herbs. Spices. Seasoning mixes without salt. Unsalted popcorn and pretzels. Fat-free sweets.  What foods are not recommended?  The items listed may not be a complete list. Talk with your dietitian about what dietary choices are best for you.  Grains  Baked goods made with fat, such as croissants, muffins, or some breads. Dry pasta or rice meal packs.  Vegetables  Creamed or fried vegetables. Vegetables in a cheese sauce. Regular canned vegetables (not low-sodium or reduced-sodium). Regular canned tomato sauce and paste (not low-sodium or reduced-sodium). Regular tomato and vegetable juice (not low-sodium or reduced-sodium). Pickles. Olives.  Fruits  Canned fruit in a light or heavy syrup. Fried fruit. Fruit in cream or butter sauce.  Meat and other protein foods  Fatty cuts of meat. Ribs. Fried meat. Bacon. Sausage. Bologna and other processed lunch meats. Salami. Fatback. Hotdogs. Bratwurst. Salted nuts and seeds. Canned beans with added salt. Canned or smoked fish. Whole eggs or egg yolks. Chicken or turkey with skin.  Dairy  Whole or 2% milk, cream, and half-and-half. Whole or full-fat cream cheese. Whole-fat or sweetened yogurt. Full-fat cheese. Nondairy creamers. Whipped toppings.  Processed cheese and cheese spreads.  Fats and oils  Butter. Stick margarine. Lard. Shortening. Ghee. Bacon fat. Tropical oils, such as coconut, palm kernel, or palm oil.  Seasoning and other foods  Salted popcorn and pretzels. Onion salt, garlic salt, seasoned salt, table salt, and sea salt. Worcestershire sauce. Tartar sauce. Barbecue sauce. Teriyaki sauce. Soy sauce, including reduced-sodium. Steak sauce. Canned and packaged gravies. Fish sauce. Oyster sauce. Cocktail sauce. Horseradish that you find on the shelf. Ketchup. Mustard. Meat flavorings and tenderizers. Bouillon cubes. Hot sauce and Tabasco sauce. Premade or packaged marinades. Premade or packaged taco seasonings. Relishes. Regular salad dressings.  Where to find more information:   National Heart, Lung, and Blood Institute: www.nhlbi.nih.gov   American Heart Association: www.heart.org  Summary   The DASH eating plan is a healthy eating plan that has been shown to reduce high blood pressure (hypertension). It may also reduce your risk for type 2 diabetes, heart disease, and stroke.   With the   DASH eating plan, you should limit salt (sodium) intake to 2,300 mg a day. If you have hypertension, you may need to reduce your sodium intake to 1,500 mg a day.   When on the DASH eating plan, aim to eat more fresh fruits and vegetables, whole grains, lean proteins, low-fat dairy, and heart-healthy fats.   Work with your health care provider or diet and nutrition specialist (dietitian) to adjust your eating plan to your individual calorie needs.  This information is not intended to replace advice given to you by your health care provider. Make sure you discuss any questions you have with your health care provider.  Document Released: 01/17/2011 Document Revised: 01/22/2016 Document Reviewed: 01/22/2016  Elsevier Interactive Patient Education  2019 [employer]vier Inc.

## 2018-01-30 LAB — TSH: TSH: 3.07 u[IU]/mL (ref 0.450–4.500)

## 2018-01-30 LAB — T4, FREE: Free T4: 1.29 ng/dL (ref 0.82–1.77)

## 2018-02-01 NOTE — Progress Notes (Signed)
Your TSH is not yet at goal. I would like for you to take synthroid on  m-f and on Sat/Sun. We can give him samples. Recheck at next visit.

## 2018-02-02 ENCOUNTER — Telehealth: Payer: Self-pay

## 2018-02-02 NOTE — Telephone Encounter (Signed)
Left the pt a message to call back for his lab results. 

## 2018-02-02 NOTE — Telephone Encounter (Signed)
-----   Message from Dorothyann Pengobyn Sanders, MD sent at 02/01/2018  7:02 PM EST ----- Your TSH is not yet at goal. I would like for you to take synthroid 50mcg on  m-f and 75mcg on Sat/Sun. We can give him samples. Recheck at next visit.

## 2018-02-03 ENCOUNTER — Other Ambulatory Visit: Payer: Self-pay

## 2018-02-03 MED ORDER — LEVOTHYROXINE SODIUM 75 MCG PO TABS: ORAL_TABLET | ORAL | 0 refills | Status: DC

## 2018-02-03 MED ORDER — SYNTHROID 50 MCG PO TABS: ORAL_TABLET | ORAL | 0 refills | Status: DC

## 2018-02-08 ENCOUNTER — Encounter: Payer: Self-pay | Admitting: Internal Medicine

## 2018-02-08 DIAGNOSIS — E039 Hypothyroidism, unspecified: Secondary | ICD-10-CM | POA: Insufficient documentation

## 2018-02-08 NOTE — Progress Notes (Signed)
Subjective:     Patient ID: Noah Shepherd , male    DOB: 05/27/1975 , 42 y.o.   MRN: [id number]   Chief Complaint  Patient presents with  . Hypertension  . Obesity    HPI  Hypertension  This is a chronic problem. The current episode started more than 1 year ago. The problem has been gradually improving since onset. The problem is controlled. Pertinent negatives include no blurred vision, chest pain, palpitations or shortness of breath. Risk factors for coronary artery disease include obesity.    Obesity  He reports compliance with his exercise regimen.  .  Past Medical History:  Diagnosis Date  . High cholesterol   . Hypertension   . Low testosterone   . Palpitations   . Sleep apnea      Family History  Problem Relation Age of Onset  . Hyperlipidemia Mother   . Hypertension Mother   . Diabetes Mother   . Hyperlipidemia Father   . Hypertension Father   . Diabetes Father   . Colon cancer Other   . Stomach cancer Other   . Heart Problems Other   . Diabetes Other   . Congenital heart disease Other      Current Outpatient Medications:  .  aspirin 81 MG tablet, Take 81 mg by mouth daily., Disp: , Rfl:  .  cetirizine (ZYRTEC) 10 MG tablet, Take 10 mg by mouth daily., Disp: , Rfl:  .  diltiazem (CARDIZEM) 30 MG tablet, Take 1 tablet every 4 hours AS NEEDED for AFIB heart rate over 100, Disp: 45 tablet, Rfl: 1 .  nebivolol (BYSTOLIC) 10 MG tablet, Take 10 mg by mouth daily., Disp: , Rfl:  .  pantoprazole (PROTONIX) 40 MG tablet, Take 40 mg by mouth daily., Disp: , Rfl:  .  rosuvastatin (CRESTOR) 20 MG tablet, Take 20 mg by mouth daily., Disp: , Rfl:  .  testosterone enanthate (DELATESTRYL) 200 MG/ML injection, Inject into the muscle every 14 (fourteen) days. For IM use only, Disp: , Rfl:  .  levothyroxine (SYNTHROID) 75 MCG tablet, Take 1 tablet by mouth on Saturday and Sunday, Disp: 30 tablet, Rfl: 0 .  SYNTHROID 50 MCG tablet, Take 1 tablet by mouth Monday -  Friday, Disp: 30 tablet, Rfl: 0   No Known Allergies   Review of Systems  Constitutional: Negative.   Eyes: Negative for blurred vision.  Respiratory: Negative.  Negative for shortness of breath.   Cardiovascular: Negative.  Negative for chest pain and palpitations.  Gastrointestinal: Negative.   Neurological: Negative.   Psychiatric/Behavioral: Negative.      Today's Vitals   01/29/18 1506  BP: 132/86  Pulse: 81  Temp: 98.6 F (37 C)  TempSrc: Oral  Weight: (!) 370 lb 12.8 oz (168.2 kg)  Height: 6\' 1"  (1.854 m)   Body mass index is 48.92 kg/m.   Objective:  Physical Exam Vitals signs and nursing note reviewed.  Constitutional:      Appearance: Normal appearance. He is obese.  HENT:     Head: Normocephalic and atraumatic.  Cardiovascular:     Rate and Rhythm: Normal rate and regular rhythm.     Heart sounds: Normal heart sounds.  Pulmonary:     Effort: Pulmonary effort is normal.     Breath sounds: Normal breath sounds.  Skin:    General: Skin is warm.  Neurological:     General: No focal deficit present.     Mental Status: He is alert.  Psychiatric:  Mood and Affect: Mood normal.         Assessment And Plan:     1. Benign hypertensive heart disease without heart failure  Fair control. He will continue with current meds.  He is encouraged to avoid adding salt to his foods.   2. Paroxysmal A-fib (HCC)  Chronic. He is encouraged to continue with current meds.   3. Primary hypothyroidism  I will check a thyroid panel and adjust meds as needed.   - TSH - T4, Free  4. Class 3 severe obesity due to excess calories with serious comorbidity and body mass index (BMI) of 45.0 to 49.9 in adult [facility](HCC)  He is encouraged to strive for BMI less than 38 to decrease cardiac risk. He is encouraged to exercise no less than 150 minutes per week. He is also encouraged to avoid sugary beverages and processed foods.   Gwynneth Alimentobyn N Charnell Peplinski, MD

## 2018-04-21 ENCOUNTER — Other Ambulatory Visit: Payer: Self-pay | Admitting: Nurse Practitioner

## 2018-04-21 ENCOUNTER — Other Ambulatory Visit: Payer: Self-pay | Admitting: Internal Medicine

## 2018-04-22 ENCOUNTER — Ambulatory Visit: Payer: 59 | Admitting: Internal Medicine

## 2018-06-18 ENCOUNTER — Other Ambulatory Visit: Payer: Self-pay

## 2018-06-18 MED ORDER — SYNTHROID 75 MCG PO TABS: ORAL_TABLET | ORAL | 0 refills | Status: DC

## 2018-06-23 ENCOUNTER — Other Ambulatory Visit: Payer: Self-pay

## 2018-06-23 ENCOUNTER — Ambulatory Visit (INDEPENDENT_AMBULATORY_CARE_PROVIDER_SITE_OTHER): Payer: 59 | Admitting: Internal Medicine

## 2018-06-23 ENCOUNTER — Encounter: Payer: Self-pay | Admitting: Internal Medicine

## 2018-06-23 VITALS — BP 114/76 | HR 78 | Temp 99.0°F | Ht 73.0 in | Wt 374.8 lb

## 2018-06-23 DIAGNOSIS — K219 Gastro-esophageal reflux disease without esophagitis: Secondary | ICD-10-CM

## 2018-06-23 DIAGNOSIS — I480 Paroxysmal atrial fibrillation: Secondary | ICD-10-CM

## 2018-06-23 DIAGNOSIS — E039 Hypothyroidism, unspecified: Secondary | ICD-10-CM

## 2018-06-23 DIAGNOSIS — I119 Hypertensive heart disease without heart failure: Secondary | ICD-10-CM

## 2018-06-23 DIAGNOSIS — Z79899 Other long term (current) drug therapy: Secondary | ICD-10-CM

## 2018-06-23 NOTE — Progress Notes (Signed)
Subjective:     Patient ID: Noah Shepherd , male    DOB: 1975/09/16 , 43 y.o.   MRN: [id number]   Chief Complaint  Patient presents with  . Hypothyroidism    HPI  Thyroid Problem  Presents for follow-up visit. Symptoms include weight gain. Patient reports no cold intolerance, constipation, depressed mood or hoarse voice. The symptoms have been stable.     Past Medical History:  Diagnosis Date  . High cholesterol   . Hypertension   . Low testosterone   . Palpitations   . Sleep apnea      Family History  Problem Relation Age of Onset  . Hyperlipidemia Mother   . Hypertension Mother   . Diabetes Mother   . Hyperlipidemia Father   . Hypertension Father   . Diabetes Father   . Colon cancer Other   . Stomach cancer Other   . Heart Problems Other   . Diabetes Other   . Congenital heart disease Other      Current Outpatient Medications:  .  aspirin 81 MG tablet, Take 81 mg by mouth daily., Disp: , Rfl:  .  BYSTOLIC 10 MG tablet, TAKE 1 TABLET BY MOUTH EVERY DAY, Disp: 30 tablet, Rfl: 8 .  cetirizine (ZYRTEC) 10 MG tablet, Take 10 mg by mouth daily., Disp: , Rfl:  .  diltiazem (CARDIZEM) 30 MG tablet, Take 1 tablet every 4 hours AS NEEDED for AFIB heart rate over 100, Disp: 45 tablet, Rfl: 1 .  Multiple Vitamins-Minerals (MULTIVITAMIN ADULTS PO), Take by mouth., Disp: , Rfl:  .  pantoprazole (PROTONIX) 40 MG tablet, TAKE 1 TABLET BY MOUTH EVERY DAY, Disp: 30 tablet, Rfl: 5 .  rosuvastatin (CRESTOR) 20 MG tablet, TAKE 1 TABLET BY MOUTH EVERY DAY, Disp: 30 tablet, Rfl: 8 .  SYNTHROID 50 MCG tablet, Take 1 tablet by mouth Monday - Friday, Disp: 30 tablet, Rfl: 0 .  SYNTHROID 75 MCG tablet, Take 1 tablet by mouth on Saturday and Sunday, Disp: 30 tablet, Rfl: 0 .  testosterone enanthate (DELATESTRYL) 200 MG/ML injection, Inject into the muscle every 14 (fourteen) days. For IM use only, Disp: , Rfl:    No Known Allergies   Review of Systems  Constitutional: Positive for  weight gain.  HENT: Negative for hoarse voice.   Respiratory: Negative.   Cardiovascular: Negative.   Gastrointestinal: Negative.  Negative for constipation.  Endocrine: Negative for cold intolerance.  Neurological: Negative.   Psychiatric/Behavioral: Negative.      Today's Vitals   06/23/18 1200  BP: 114/76  Pulse: 78  Temp: 99 F (37.2 C)  TempSrc: Oral  Weight: (!) 374 lb 12.8 oz (170 kg)  Height: '6\' 1"'$  (1.854 m)   Body mass index is 49.45 kg/m.   Objective:  Physical Exam Vitals signs and nursing note reviewed.  Constitutional:      Appearance: Normal appearance.  Cardiovascular:     Rate and Rhythm: Normal rate and regular rhythm.     Heart sounds: Normal heart sounds.  Pulmonary:     Effort: Pulmonary effort is normal.     Breath sounds: Normal breath sounds.  Skin:    General: Skin is warm.  Neurological:     General: No focal deficit present.     Mental Status: He is alert.  Psychiatric:        Mood and Affect: Mood normal.         Assessment And Plan:     1. Primary hypothyroidism  I will check thyroid panel and adjust meds as needed.  - HIV antibody (with reflex) - TSH - T4, Free  2. Benign hypertensive heart disease without heart failure  Well controlled. He will continue with current meds. He is encouraged to avoid adding salt to his foods. Importance of regular exercise was discussed with the patient.   - BMP8+EGFR - Hemoglobin A1c  3. Paroxysmal A-fib (HCC)  Chronic, yet stable. He is currently in sinus rhythm.   4. Gastroesophageal reflux disease without esophagitis  Chronic, he reports compliance with meds. He is encouraged to stop eating 3 hours prior to going to bed.   - Vitamin B12  5. Drug therapy  I will check vitamin B12 levels today.   Maximino Greenland, MD    THE PATIENT IS ENCOURAGED TO PRACTICE SOCIAL DISTANCING DUE TO THE COVID-19 PANDEMIC.

## 2018-06-23 NOTE — Patient Instructions (Signed)

## 2018-06-24 LAB — BMP8+EGFR
BUN/Creatinine Ratio: 10 (ref 9–20)
BUN: 11 mg/dL (ref 6–24)
CO2: 19 mmol/L — ABNORMAL LOW (ref 20–29)
Calcium: 10.5 mg/dL — ABNORMAL HIGH (ref 8.7–10.2)
Chloride: 102 mmol/L (ref 96–106)
Creatinine, Ser: 1.06 mg/dL (ref 0.76–1.27)
GFR calc Af Amer: 98 mL/min/{1.73_m2} (ref >59)
GFR calc non Af Amer: 85 mL/min/{1.73_m2} (ref >59)
Glucose: 77 mg/dL (ref 65–99)
Potassium: 4.4 mmol/L (ref 3.5–5.2)
Sodium: 138 mmol/L (ref 134–144)

## 2018-06-24 LAB — HEMOGLOBIN A1C
Est. average glucose Bld gHb Est-mCnc: 120 mg/dL
Hgb A1c MFr Bld: 5.8 % — ABNORMAL HIGH (ref 4.8–5.6)

## 2018-06-24 LAB — HIV ANTIBODY (ROUTINE TESTING W REFLEX): HIV Screen 4th Generation wRfx: NONREACTIVE

## 2018-06-24 LAB — VITAMIN B12: Vitamin B-12: 704 pg/mL (ref 232–1245)

## 2018-06-24 LAB — TSH: TSH: 2.39 u[IU]/mL (ref 0.450–4.500)

## 2018-06-24 LAB — T4, FREE: Free T4: 1.1 ng/dL (ref 0.82–1.77)

## 2018-06-25 ENCOUNTER — Telehealth: Payer: Self-pay

## 2018-06-25 ENCOUNTER — Other Ambulatory Visit: Payer: Self-pay | Admitting: Internal Medicine

## 2018-06-25 NOTE — Telephone Encounter (Signed)
Left the patient a message to call back for lab results. 

## 2018-06-25 NOTE — Telephone Encounter (Signed)
-----   Message from Dorothyann Peng, MD sent at 06/24/2018  5:53 PM EDT ----- HIV negative. Vit b12 level is wnl. Thyroid function is stable. Be sure to take first thing in am, wait an hour before eating or drinking. Kidney fxn is stable. Prediabetic with a1c 5.8.

## 2018-06-26 MED ORDER — SYNTHROID 75 MCG PO TABS: ORAL_TABLET | ORAL | 0 refills | Status: DC

## 2018-06-29 ENCOUNTER — Other Ambulatory Visit: Payer: Self-pay

## 2018-06-29 ENCOUNTER — Telehealth: Payer: Self-pay

## 2018-06-29 DIAGNOSIS — E039 Hypothyroidism, unspecified: Secondary | ICD-10-CM

## 2018-06-29 MED ORDER — SYNTHROID 50 MCG PO TABS: ORAL_TABLET | ORAL | 1 refills | Status: DC

## 2018-06-29 MED ORDER — SYNTHROID 75 MCG PO TABS: ORAL_TABLET | ORAL | 1 refills | Status: DC

## 2018-06-29 NOTE — Telephone Encounter (Signed)
Lab results given

## 2018-08-12 ENCOUNTER — Ambulatory Visit (HOSPITAL_COMMUNITY): Payer: 59 | Admitting: Nurse Practitioner

## 2018-08-13 ENCOUNTER — Ambulatory Visit (HOSPITAL_COMMUNITY): Payer: 59 | Admitting: Nurse Practitioner

## 2018-08-27 ENCOUNTER — Encounter (HOSPITAL_COMMUNITY): Payer: Self-pay | Admitting: Nurse Practitioner

## 2018-08-27 ENCOUNTER — Other Ambulatory Visit: Payer: Self-pay

## 2018-08-27 ENCOUNTER — Ambulatory Visit (HOSPITAL_COMMUNITY)
Admission: RE | Admit: 2018-08-27 | Discharge: 2018-08-27 | Disposition: A | Discharge status: Home / Self Care | Payer: 59 | Source: Ambulatory Visit | Attending: Nurse Practitioner | Admitting: Nurse Practitioner

## 2018-08-27 VITALS — BP 126/74 | HR 81 | Ht 73.0 in | Wt 373.0 lb

## 2018-08-27 DIAGNOSIS — I480 Paroxysmal atrial fibrillation: Secondary | ICD-10-CM | POA: Diagnosis not present

## 2018-08-27 DIAGNOSIS — G4730 Sleep apnea, unspecified: Secondary | ICD-10-CM | POA: Insufficient documentation

## 2018-08-27 DIAGNOSIS — Z8249 Family history of ischemic heart disease and other diseases of the circulatory system: Secondary | ICD-10-CM | POA: Diagnosis not present

## 2018-08-27 DIAGNOSIS — I10 Essential (primary) hypertension: Secondary | ICD-10-CM | POA: Insufficient documentation

## 2018-08-27 DIAGNOSIS — Z79890 Hormone replacement therapy (postmenopausal): Secondary | ICD-10-CM | POA: Diagnosis not present

## 2018-08-27 DIAGNOSIS — Z79899 Other long term (current) drug therapy: Secondary | ICD-10-CM | POA: Diagnosis not present

## 2018-08-27 DIAGNOSIS — E7800 Pure hypercholesterolemia, unspecified: Secondary | ICD-10-CM | POA: Diagnosis not present

## 2018-08-27 DIAGNOSIS — Z6841 Body Mass Index (BMI) 40.0 and over, adult: Secondary | ICD-10-CM | POA: Diagnosis not present

## 2018-08-27 DIAGNOSIS — Z7982 Long term (current) use of aspirin: Secondary | ICD-10-CM | POA: Insufficient documentation

## 2018-08-27 NOTE — Patient Instructions (Signed)
You are scheduled for an Echocardiogram on 01/04/2019 at 11 a.m.   Please arrive at the same check in desk as if you were seeing [place].  We will call with results and go from there.

## 2018-08-27 NOTE — Progress Notes (Signed)
Primary Care Physician: Dorothyann PengSanders, Robyn, MD Ref[facility] Phys[facility]   Noah Shepherd is a 43 y.o. male with a h/o HTN, low testosterone, treated sleep apnea,using cpap that is in the afib clinic for f/u of new onset afib last year.Marland Kitchen. He took a sudafed for some sinus concerns and noted an irregular, fast heartbeat around 11 pm. When it continued into the next day, he presented to the ER. EKG did showed afib in the 90's. He was successfully cardioverted. He was on anticoagulation x 4 weeks then stopped drug for a chadsvasc score of 1.  He denies any tobacco use, any alcohol or street drugs. He does use cpap religiously. He is morbidly obese . He is walking for exercise. Notices mild LE edema intermittently, usually elevating legs resolves the edema.  He reports  only one episode of irregular heart beat about 2 weeks ago and took the 30 mg Cardizem. It was resolved within 2 hours.  He did have a mildly dilated root on last echo in November of last year.  Today, he denies symptoms of palpitations, chest pain, shortness of breath, orthopnea, PND, lower extremity edema, dizziness, presyncope, syncope, or neurologic sequela. The patient is tolerating medications without difficulties and is otherwise without complaint today.   Past Medical History:  Diagnosis Date  . High cholesterol   . Hypertension   . Low testosterone   . Palpitations   . Sleep apnea    No past surgical history on file.  Current Outpatient Medications  Medication Sig Dispense Refill  . aspirin 81 MG tablet Take 81 mg by mouth daily.    Marland Kitchen. BYSTOLIC 10 MG tablet TAKE 1 TABLET BY MOUTH EVERY DAY 30 tablet 8  . cetirizine (ZYRTEC) 10 MG tablet Take 10 mg by mouth daily.    Marland Kitchen. diltiazem (CARDIZEM) 30 MG tablet Take 1 tablet every 4 hours AS NEEDED for AFIB heart rate over 100 45 tablet 1  . Multiple Vitamins-Minerals (MULTIVITAMIN ADULTS PO) Take by mouth.    . pantoprazole (PROTONIX) 40 MG tablet TAKE 1 TABLET BY  MOUTH EVERY DAY 30 tablet 5  . rosuvastatin (CRESTOR) 20 MG tablet TAKE 1 TABLET BY MOUTH EVERY DAY 30 tablet 8  . SYNTHROID 50 MCG tablet Take 1 tablet by mouth Monday - Friday 90 tablet 1  . SYNTHROID 75 MCG tablet Take 1 tablet by mouth on Saturday and Sunday 90 tablet 1  . testosterone enanthate (DELATESTRYL) 200 MG/ML injection Inject into the muscle every 14 (fourteen) days. For IM use only     No current facility-administered medications for this encounter.     No Known Allergies  Social History   Socioeconomic History  . Marital status: Single    Spouse name: Not on file  . Number of children: Not on file  . Years of education: Not on file  . Highest education level: Not on file  Occupational History  . Not on file  Social Needs  . Financial resource strain: Not on file  . Food insecurity    Worry: Not on file    Inability: Not on file  . Transportation needs    Medical: Not on file    Non-medical: Not on file  Tobacco Use  . Smoking status: Never Smoker  . Smokeless tobacco: Never Used  Substance and Sexual Activity  . Alcohol use: Not Currently  . Drug use: Never  . Sexual activity: Not on file  Lifestyle  . Physical activity  Days per week: Not on file    Minutes per session: Not on file  . Stress: Not on file  Relationships  . Social Herbalist on phone: Not on file    Gets together: Not on file    Attends religious service: Not on file    Active member of club or organization: Not on file    Attends meetings of clubs or organizations: Not on file    Relationship status: Not on file  . Intimate partner violence    Fear of current or ex partner: Not on file    Emotionally abused: Not on file    Physically abused: Not on file    Forced sexual activity: Not on file  Other Topics Concern  . Not on file  Social History Narrative   ** Merged History Encounter **        Family History  Problem Relation Age of Onset  . Hyperlipidemia  Mother   . Hypertension Mother   . Diabetes Mother   . Hyperlipidemia Father   . Hypertension Father   . Diabetes Father   . Colon cancer Other   . Stomach cancer Other   . Heart Problems Other   . Diabetes Other   . Congenital heart disease Other     ROS- All systems are reviewed and negative except as per the HPI above  Physical Exam: Vitals:   08/27/18 0847  BP: 126/74  Pulse: 81  Weight: (!) 169.2 kg  Height: 6\' 1"  (1.854 m)   Wt Readings from Last 3 Encounters:  08/27/18 (!) 169.2 kg  06/23/18 (!) 170 kg  01/29/18 (!) 168.2 kg    Labs: Lab Results  Component Value Date   NA 138 06/23/2018   K 4.4 06/23/2018   CL 102 06/23/2018   CO2 19 (L) 06/23/2018   GLUCOSE 77 06/23/2018   BUN 11 06/23/2018   CREATININE 1.06 06/23/2018   CALCIUM 10.5 (H) 06/23/2018   MG 2.0 12/02/2017   No results found for: INR No results found for: CHOL, HDL, LDLCALC, TRIG   GEN- The patient is well appearing, alert and oriented x 3 today.   Head- normocephalic, atraumatic Eyes-  Sclera clear, conjunctiva pink Ears- hearing intact Oropharynx- clear Neck- supple, no JVP Lymph- no cervical lymphadenopathy Lungs- Clear to ausculation bilaterally, normal work of breathing Heart- Regular rate and rhythm, no murmurs, rubs or gallops, PMI not laterally displaced GI- soft, NT, ND, + BS Extremities- no clubbing, cyanosis, or edema MS- no significant deformity or atrophy Skin- no rash or lesion Psych- euthymic mood, full affect Neuro- strength and sensation are intact  EKG-NSR at 81 bpm, Pr int 106 ms, qrs int 92 ms, qtc 429 ms Epic records reviewed Echo-Study Conclusions  - Left ventricle: The cavity size was normal. Wall thickness was   increased in a pattern of mild LVH. Systolic function was normal.   The estimated ejection fraction was in the range of 55% to 60%.   Wall motion was normal; there were no regional wall motion   abnormalities. Doppler parameters are consistent  with abnormal   left ventricular relaxation (grade 1 diastolic dysfunction). - Aortic root: The aortic root was mildly dilated.  Impressions:  - Normal LV systolic function; mild LVH; probable mild diastolic   dysfunction; mildly dilated aortic root.  -------------------------------------------------------------------   Assessment and Plan: 1. New onset afib Successful cardioversion in the ER 11/2017 Only on episode of irregular rhythm since then and  resolved with one 30 mg Cardizem tablet Triggers discussed Continue nebivolol 10 mg qd  Will repeat echo this November to assess if aortic root dilation has increased Continue to use cpap Avoid decongestants 30 mg Cardizem to use as needed if breakthrough afib should return  2. Chadsvasc score of 1(htn) Does not meet guidelines to use daily anticoagulation   3.HTN Stable  4. BMI of 49.34 Weight loss and exercise encouraged   F/u will depend on results of echo, which will be discussed with pt when report received  Noah Shepherd, ANP-C [facility] [address] [place], [place] [address] [phone]

## 2018-09-05 ENCOUNTER — Other Ambulatory Visit: Payer: Self-pay | Admitting: Internal Medicine

## 2018-09-05 DIAGNOSIS — E039 Hypothyroidism, unspecified: Secondary | ICD-10-CM

## 2018-10-27 ENCOUNTER — Encounter: Payer: 59 | Admitting: Internal Medicine

## 2018-10-27 ENCOUNTER — Encounter: Payer: Self-pay | Admitting: Internal Medicine

## 2018-10-27 ENCOUNTER — Other Ambulatory Visit: Payer: Self-pay

## 2018-10-27 ENCOUNTER — Ambulatory Visit (INDEPENDENT_AMBULATORY_CARE_PROVIDER_SITE_OTHER): Payer: 59 | Admitting: Internal Medicine

## 2018-10-27 VITALS — BP 138/82 | HR 74 | Temp 98.1°F | Ht 71.8 in | Wt 361.6 lb

## 2018-10-27 DIAGNOSIS — Z6841 Body Mass Index (BMI) 40.0 and over, adult: Secondary | ICD-10-CM

## 2018-10-27 DIAGNOSIS — I119 Hypertensive heart disease without heart failure: Secondary | ICD-10-CM | POA: Diagnosis not present

## 2018-10-27 DIAGNOSIS — Z0000 Encounter for general adult medical examination without abnormal findings: Secondary | ICD-10-CM | POA: Diagnosis not present

## 2018-10-27 DIAGNOSIS — E559 Vitamin D deficiency, unspecified: Secondary | ICD-10-CM

## 2018-10-27 DIAGNOSIS — R259 Unspecified abnormal involuntary movements: Secondary | ICD-10-CM | POA: Diagnosis not present

## 2018-10-27 DIAGNOSIS — G252 Other specified forms of tremor: Secondary | ICD-10-CM

## 2018-10-27 LAB — POCT URINALYSIS DIPSTICK
Glucose, UA: NEGATIVE
Leukocytes, UA: NEGATIVE
Nitrite, UA: NEGATIVE
Protein, UA: POSITIVE — AB
Spec Grav, UA: 1.025 (ref 1.010–1.025)
Urobilinogen, UA: 0.2 E.U./dL
pH, UA: 6 (ref 5.0–8.0)

## 2018-10-27 LAB — POCT UA - MICROALBUMIN
Albumin/Creatinine Ratio, Urine, POC: 300
Creatinine, POC: 300 mg/dL
Microalbumin Ur, POC: 80 mg/L

## 2018-10-27 NOTE — Progress Notes (Signed)
Subjective:     Patient ID: Noah Shepherd , male    DOB: 03/25/1975 , 43 y.o.   MRN: [id number]   Chief Complaint  Patient presents with  . Annual Exam  . Hypertension    HPI  He is here today for a full physical examination. He has prostate exams performed by Urology. He has no specific concerns or complaints at this time.   Hypertension This is a chronic problem. The current episode started more than 1 year ago. The problem has been gradually improving since onset. The problem is controlled. Pertinent negatives include no blurred vision, chest pain, palpitations or shortness of breath. Past treatments include beta blockers and calcium channel blockers. The current treatment provides moderate improvement.     Past Medical History:  Diagnosis Date  . High cholesterol   . Hypertension   . Low testosterone   . Palpitations   . Sleep apnea      Family History  Problem Relation Age of Onset  . Hyperlipidemia Mother   . Hypertension Mother   . Diabetes Mother   . Hyperlipidemia Father   . Hypertension Father   . Diabetes Father   . Colon cancer Other   . Stomach cancer Other   . Heart Problems Other   . Diabetes Other   . Congenital heart disease Other      Current Outpatient Medications:  .  aspirin 81 MG tablet, Take 81 mg by mouth daily., Disp: , Rfl:  .  BYSTOLIC 10 MG tablet, TAKE 1 TABLET BY MOUTH EVERY DAY, Disp: 30 tablet, Rfl: 8 .  cetirizine (ZYRTEC) 10 MG tablet, Take 10 mg by mouth daily., Disp: , Rfl:  .  diltiazem (CARDIZEM) 30 MG tablet, Take 1 tablet every 4 hours AS NEEDED for AFIB heart rate over 100, Disp: 45 tablet, Rfl: 1 .  Multiple Vitamins-Minerals (MULTIVITAMIN ADULTS PO), Take by mouth., Disp: , Rfl:  .  pantoprazole (PROTONIX) 40 MG tablet, TAKE 1 TABLET BY MOUTH EVERY DAY, Disp: 30 tablet, Rfl: 5 .  rosuvastatin (CRESTOR) 20 MG tablet, TAKE 1 TABLET BY MOUTH EVERY DAY, Disp: 30 tablet, Rfl: 8 .  SYNTHROID 50 MCG tablet, TAKE 1 TABLET BY  MOUTH EVERY DAY, Disp: 31 tablet, Rfl: 9 .  SYNTHROID 75 MCG tablet, Take 1 tablet by mouth on Saturday and Sunday, Disp: 90 tablet, Rfl: 1 .  testosterone enanthate (DELATESTRYL) 200 MG/ML injection, Inject into the muscle every 14 (fourteen) days. For IM use only, Disp: , Rfl:    No Known Allergies   Review of Systems  Constitutional: Negative.   HENT: Negative.   Eyes: Negative.  Negative for blurred vision.  Respiratory: Negative.  Negative for shortness of breath.   Cardiovascular: Negative.  Negative for chest pain and palpitations.  Endocrine: Negative.   Genitourinary: Negative.   Musculoskeletal: Negative.   Skin: Negative.   Allergic/Immunologic: Negative.   Neurological: Positive for tremors.       He is concerned about having tremors. He first noticed this months ago. He reports his mother has similar symptoms. He noticed it when eating, sometimes food falls off of eating utensil due to the tremor.  He is unable to determine what triggers his sx.   Hematological: Negative.   Psychiatric/Behavioral: Negative.      Today's Vitals   10/27/18 1447  BP: 138/82  Pulse: 74  Temp: 98.1 F (36.7 C)  TempSrc: Oral  SpO2: 96%  Weight: (!) 361 lb 9.6 oz (164 kg)  Height: 5' 11.8" (1.824 m)   Body mass index is 49.32 kg/m.   Objective:  Physical Exam Vitals signs and nursing note reviewed.  Constitutional:      Appearance: Normal appearance. He is obese.  HENT:     Head: Normocephalic and atraumatic.     Right Ear: Tympanic membrane, ear canal and external ear normal.     Left Ear: Tympanic membrane, ear canal and external ear normal.     Nose: Nose normal.     Mouth/Throat:     Mouth: Mucous membranes are moist.     Pharynx: Oropharynx is clear.  Eyes:     Extraocular Movements: Extraocular movements intact.     Conjunctiva/sclera: Conjunctivae normal.     Pupils: Pupils are equal, round, and reactive to light.  Neck:     Musculoskeletal: Normal range of motion  and neck supple.  Cardiovascular:     Rate and Rhythm: Normal rate and regular rhythm.     Pulses: Normal pulses.     Heart sounds: Normal heart sounds.  Pulmonary:     Effort: Pulmonary effort is normal.     Breath sounds: Normal breath sounds.  Chest:     Breasts:        Right: Normal. No swelling, bleeding, inverted nipple, mass or nipple discharge.        Left: Normal. No swelling, bleeding, inverted nipple, mass or nipple discharge.  Abdominal:     General: Bowel sounds are normal.     Palpations: Abdomen is soft.     Comments: Obese, no organomegaly identified.   Genitourinary:    Rectum: Guaiac result negative.     Comments: deferred Musculoskeletal: Normal range of motion.  Skin:    General: Skin is warm.  Neurological:     General: No focal deficit present.     Mental Status: He is alert and oriented to person, place, and time. Mental status is at baseline.     Cranial Nerves: Cranial nerves are intact.     Sensory: Sensation is intact.     Motor: Tremor present.     Gait: Gait is intact.     Deep Tendon Reflexes: Reflexes are normal and symmetric.     Comments: He has b/l UE resting tremor.   Psychiatric:        Mood and Affect: Mood normal.        Behavior: Behavior normal.         Assessment And Plan:     1. Encounter for annual physical exam   A full exam was performed.  DRE deferred. I will refer him to GI for CRC screening. PATIENT HAS BEEN ADVISED TO GET 30-45 MINUTES REGULAR EXERCISE NO LESS THAN FOUR TO FIVE DAYS PER WEEK - BOTH WEIGHTBEARING EXERCISES AND AEROBIC ARE RECOMMENDED.  SHE WAS ADVISED TO FOLLOW A HEALTHY DIET WITH AT LEAST SIX FRUITS/VEGGIES PER DAY, DECREASE INTAKE OF RED MEAT, AND TO INCREASE FISH INTAKE TO TWO DAYS PER WEEK.  MEATS/FISH SHOULD NOT BE FRIED, BAKED OR BROILED IS PREFERABLE.  I SUGGEST WEARING SPF 50 SUNSCREEN ON EXPOSED PARTS AND ESPECIALLY WHEN IN THE DIRECT SUNLIGHT FOR AN EXTENDED PERIOD OF TIME.  PLEASE AVOID FAST FOOD  RESTAURANTS AND INCREASE YOUR WATER INTAKE.  - POCT Urinalysis Dipstick (81002) - POCT UA - Microalbumin - EKG 12-Lead - CMP14+EGFR - CBC - Lipid panel - Hemoglobin A1c - Testosterone,Free and Total  2. Benign hypertensive heart disease without heart failure  Chronic, fair control.  He will continue with current meds for now.  EKG performed, no new changes noted. He is encouraged to continue with his regular exercise regimen. He will rto in six months for re-evaluation.   - POCT Urinalysis Dipstick (81002) - POCT UA - Microalbumin - EKG 12-Lead   3. Resting tremor  I will refer him to Neuro for further evaluation. I will check TSH today as well.   - Ambulatory referral to Neurology  4. Vitamin D deficiency disease  I WILL CHECK A VIT D LEVEL AND SUPPLEMENT AS NEEDED.  ALSO ENCOURAGED TO SPEND 15 MINUTES IN THE SUN DAILY.  - Vitamin D (25 hydroxy)  5. Class 3 severe obesity due to excess calories with serious comorbidity and body mass index (BMI) of 45.0 to 49.9 in adult [facility])  He was congratulated on his 12 pound weight loss since his last visit. He is encouraged to lose another 12 pounds by the end of the year. He is encouraged to exercise at least 30 minutes five days weekly.    Maximino Greenland, MD    THE PATIENT IS ENCOURAGED TO PRACTICE SOCIAL DISTANCING DUE TO THE COVID-19 PANDEMIC.

## 2018-10-27 NOTE — Patient Instructions (Signed)

## 2018-10-29 ENCOUNTER — Telehealth: Payer: Self-pay

## 2018-10-29 LAB — CBC
Hematocrit: 55.7 % — ABNORMAL HIGH (ref 37.5–51.0)
Hemoglobin: 18.6 g/dL — ABNORMAL HIGH (ref 13.0–17.7)
MCH: 30 pg (ref 26.6–33.0)
MCHC: 33.4 g/dL (ref 31.5–35.7)
MCV: 90 fL (ref 79–97)
Platelets: 244 10*3/uL (ref 150–450)
RBC: 6.21 x10E6/uL — ABNORMAL HIGH (ref 4.14–5.80)
RDW: 12.3 % (ref 11.6–15.4)
WBC: 6.8 10*3/uL (ref 3.4–10.8)

## 2018-10-29 LAB — LIPID PANEL
Chol/HDL Ratio: 4.5 ratio (ref 0.0–5.0)
Cholesterol, Total: 127 mg/dL (ref 100–199)
HDL: 28 mg/dL — ABNORMAL LOW (ref >39)
LDL Chol Calc (NIH): 77 mg/dL (ref 0–99)
Triglycerides: 117 mg/dL (ref 0–149)
VLDL Cholesterol Cal: 22 mg/dL (ref 5–40)

## 2018-10-29 LAB — CMP14+EGFR
ALT: 34 IU/L (ref 0–44)
AST: 29 IU/L (ref 0–40)
Albumin/Globulin Ratio: 1.7 (ref 1.2–2.2)
Albumin: 4.8 g/dL (ref 4.0–5.0)
Alkaline Phosphatase: 62 IU/L (ref 39–117)
BUN/Creatinine Ratio: 8 — ABNORMAL LOW (ref 9–20)
BUN: 10 mg/dL (ref 6–24)
Bilirubin Total: 1.4 mg/dL — ABNORMAL HIGH (ref 0.0–1.2)
CO2: 21 mmol/L (ref 20–29)
Calcium: 9.7 mg/dL (ref 8.7–10.2)
Chloride: 101 mmol/L (ref 96–106)
Creatinine, Ser: 1.18 mg/dL (ref 0.76–1.27)
GFR calc Af Amer: 86 mL/min/{1.73_m2} (ref >59)
GFR calc non Af Amer: 74 mL/min/{1.73_m2} (ref >59)
Globulin, Total: 2.9 g/dL (ref 1.5–4.5)
Glucose: 85 mg/dL (ref 65–99)
Potassium: 4.1 mmol/L (ref 3.5–5.2)
Sodium: 138 mmol/L (ref 134–144)
Total Protein: 7.7 g/dL (ref 6.0–8.5)

## 2018-10-29 LAB — HEMOGLOBIN A1C
Est. average glucose Bld gHb Est-mCnc: 105 mg/dL
Hgb A1c MFr Bld: 5.3 % (ref 4.8–5.6)

## 2018-10-29 LAB — TESTOSTERONE,FREE AND TOTAL
Testosterone, Free: 15.3 pg/mL (ref 6.8–21.5)
Testosterone: 449 ng/dL (ref 264–916)

## 2018-10-29 LAB — VITAMIN D 25 HYDROXY (VIT D DEFICIENCY, FRACTURES): Vit D, 25-Hydroxy: 23.7 ng/mL — ABNORMAL LOW (ref 30.0–100.0)

## 2018-10-29 NOTE — Telephone Encounter (Signed)
I called patient to see if he is taking his cholesterol medicine he stated he was taking it but he has missed a couple doses. I advised patient that we have received a letter stating he is not filling his medication as directed. He stated he picked up a refill a couple weeks ago. YRL,RMA

## 2018-10-31 ENCOUNTER — Other Ambulatory Visit: Payer: Self-pay | Admitting: Internal Medicine

## 2018-10-31 MED ORDER — VITAMIN D (ERGOCALCIFEROL) 1.25 MG (50000 UNIT) PO CAPS: ORAL_CAPSULE | ORAL | 0 refills | Status: DC

## 2018-11-02 ENCOUNTER — Encounter: Payer: Self-pay | Admitting: Internal Medicine

## 2018-11-03 ENCOUNTER — Encounter: Payer: Self-pay | Admitting: Internal Medicine

## 2018-11-09 ENCOUNTER — Encounter: Payer: Self-pay | Admitting: Internal Medicine

## 2018-11-10 ENCOUNTER — Other Ambulatory Visit: Payer: Self-pay | Admitting: Internal Medicine

## 2018-11-30 ENCOUNTER — Other Ambulatory Visit: Payer: Self-pay | Admitting: Internal Medicine

## 2018-12-09 ENCOUNTER — Ambulatory Visit: Payer: 59 | Admitting: Neurology

## 2018-12-09 ENCOUNTER — Telehealth: Payer: Self-pay

## 2018-12-09 NOTE — Telephone Encounter (Signed)
Pt did not show for their appt with Dr. Athar today.  

## 2018-12-22 ENCOUNTER — Other Ambulatory Visit (HOSPITAL_COMMUNITY): Payer: Self-pay | Admitting: Nurse Practitioner

## 2018-12-29 ENCOUNTER — Other Ambulatory Visit: Payer: Self-pay | Admitting: Internal Medicine

## 2019-01-04 ENCOUNTER — Ambulatory Visit (HOSPITAL_COMMUNITY): Payer: 59

## 2019-01-18 ENCOUNTER — Other Ambulatory Visit: Payer: Self-pay | Admitting: Internal Medicine

## 2019-01-21 ENCOUNTER — Other Ambulatory Visit (HOSPITAL_COMMUNITY)
Admission: RE | Admit: 2019-01-21 | Discharge: 2019-01-21 | Disposition: A | Discharge status: Home / Self Care | Payer: 59 | Source: Ambulatory Visit | Attending: Nurse Practitioner | Admitting: Nurse Practitioner

## 2019-01-21 ENCOUNTER — Ambulatory Visit (INDEPENDENT_AMBULATORY_CARE_PROVIDER_SITE_OTHER): Payer: 59 | Admitting: Nurse Practitioner

## 2019-01-21 ENCOUNTER — Encounter: Payer: Self-pay | Admitting: Nurse Practitioner

## 2019-01-21 ENCOUNTER — Other Ambulatory Visit: Payer: Self-pay

## 2019-01-21 VITALS — BP 144/80 | HR 81 | Temp 98.7°F | Ht 71.8 in | Wt 366.2 lb

## 2019-01-21 DIAGNOSIS — R3911 Hesitancy of micturition: Secondary | ICD-10-CM

## 2019-01-21 DIAGNOSIS — R300 Dysuria: Secondary | ICD-10-CM | POA: Insufficient documentation

## 2019-01-21 DIAGNOSIS — I10 Essential (primary) hypertension: Secondary | ICD-10-CM | POA: Diagnosis not present

## 2019-01-21 LAB — POCT URINALYSIS DIPSTICK
Bilirubin, UA: NEGATIVE
Glucose, UA: NEGATIVE
Ketones, UA: NEGATIVE
Leukocytes, UA: NEGATIVE
Nitrite, UA: NEGATIVE
Protein, UA: NEGATIVE
Spec Grav, UA: 1.02 (ref 1.010–1.025)
Urobilinogen, UA: 0.2 E.U./dL
pH, UA: 6 (ref 5.0–8.0)

## 2019-01-21 NOTE — Progress Notes (Signed)
This visit occurred during the SARS-CoV-2 public health emergency.  Safety protocols were in place, including screening questions prior to the visit, additional usage of staff PPE, and extensive cleaning of exam room while observing appropriate contact time as indicated for disinfecting solutions.  Subjective:     Patient ID: Noah Shepherd , male    DOB: May 19, 1975 , 43 y.o.   MRN: [id number]   Chief Complaint  Patient presents with  . Urinary Tract Infection    HPI  Urinary Tract Infection  This is a new problem. The current episode started 1 to 4 weeks ago (under 2 weeks). The problem occurs intermittently. The problem has been unchanged. Quality: nagging pain to the genital areas. There has been no fever. He is sexually active. There is no history of pyelonephritis. Pertinent negatives include no chills, discharge, flank pain, frequency or urgency. He has tried nothing for the symptoms. There is no history of catheterization or recurrent UTIs.     Past Medical History:  Diagnosis Date  . High cholesterol   . Hypertension   . Low testosterone   . Palpitations   . Sleep apnea      Family History  Problem Relation Age of Onset  . Hyperlipidemia Mother   . Hypertension Mother   . Diabetes Mother   . Hyperlipidemia Father   . Hypertension Father   . Diabetes Father   . Colon cancer Other   . Stomach cancer Other   . Heart Problems Other   . Diabetes Other   . Congenital heart disease Other      Current Outpatient Medications:  .  aspirin 81 MG tablet, Take 81 mg by mouth daily., Disp: , Rfl:  .  BYSTOLIC 10 MG tablet, TAKE 1 TABLET BY MOUTH EVERY DAY, Disp: 30 tablet, Rfl: 8 .  cetirizine (ZYRTEC) 10 MG tablet, Take 10 mg by mouth daily., Disp: , Rfl:  .  diltiazem (CARDIZEM) 30 MG tablet, TAKE 1 TABLET EVERY 4 HOURS AS NEEDED FOR AFIB HEART RATE OVER 100, Disp: 45 tablet, Rfl: 1 .  Multiple Vitamins-Minerals (MULTIVITAMIN ADULTS PO), Take by mouth., Disp: , Rfl:   .  pantoprazole (PROTONIX) 40 MG tablet, TAKE 1 TABLET BY MOUTH EVERY DAY, Disp: 30 tablet, Rfl: 5 .  rosuvastatin (CRESTOR) 20 MG tablet, TAKE 1 TABLET BY MOUTH EVERY DAY, Disp: 30 tablet, Rfl: 8 .  SYNTHROID 50 MCG tablet, TAKE 1 TABLET BY MOUTH EVERY DAY (Patient taking differently: TAKE 1 TABLET BY Monday - FRIDAY), Disp: 31 tablet, Rfl: 9 .  SYNTHROID 75 MCG tablet, Take 1 tablet by mouth on Saturday and Sunday, Disp: 90 tablet, Rfl: 1 .  testosterone enanthate (DELATESTRYL) 200 MG/ML injection, Inject into the muscle every 14 (fourteen) days. For IM use only, Disp: , Rfl:  .  Vitamin D, Ergocalciferol, (DRISDOL) 1.25 MG (50000 UT) CAPS capsule, TAKE ONE CAPSULE TWICE WEEKLY ON TUESDAYS AND FRIDAYS, Disp: 8 capsule, Rfl: 1   No Known Allergies   Review of Systems  Constitutional: Negative for chills and fatigue.  Respiratory: Negative.   Cardiovascular: Negative.  Negative for chest pain, palpitations and leg swelling.  Genitourinary: Negative for flank pain, frequency and urgency.  Neurological: Negative for dizziness and headaches.     Today's Vitals   01/21/19 0908  BP: (!) 144/80  Pulse: 81  Temp: 98.7 F (37.1 C)  TempSrc: Oral  Weight: (!) 366 lb 3.2 oz (166.1 kg)  Height: 5' 11.8" (1.824 m)   Body mass  index is 49.94 kg/m.   Objective:  Physical Exam Constitutional:      General: He is not in acute distress.    Appearance: Normal appearance. He is obese.  Cardiovascular:     Rate and Rhythm: Normal rate and regular rhythm.     Pulses: Normal pulses.     Heart sounds: No murmur.  Pulmonary:     Effort: Pulmonary effort is normal. No respiratory distress.     Breath sounds: Normal breath sounds.  Abdominal:     General: Bowel sounds are normal. There is no distension.     Palpations: Abdomen is soft.  Skin:    Capillary Refill: Capillary refill takes less than 2 seconds.  Neurological:     General: No focal deficit present.     Mental Status: He is alert  and oriented to person, place, and time.  Psychiatric:        Mood and Affect: Mood normal.        Behavior: Behavior normal.        Thought Content: Thought content normal.        Judgment: Judgment normal.         Assessment And Plan:     1. Dysuria  Trace blood in urinalysis  Will send for STD panel - POCT Urinalysis Dipstick (81002) - Culture, Urine - Urine cytology ancillary only - HIV antibody (with reflex) - T pallidum Screening Cascade - PSA  2. Urinary hesitancy  Will check PSA level  This could be related to prostate enlargement vs infectious process  He has a urologist he plans to follow up with  3. Essential hypertension  Elevated today, he takes his medication at night.   Advised to make sure he is well hydrated with water   He admits to eating fast food last night.    Arnette Felts, FNP    THE PATIENT IS ENCOURAGED TO PRACTICE SOCIAL DISTANCING DUE TO THE COVID-19 PANDEMIC.

## 2019-01-22 LAB — URINE CYTOLOGY ANCILLARY ONLY
Chlamydia: NEGATIVE
Comment: NEGATIVE
Comment: NEGATIVE
Comment: NORMAL
Neisseria Gonorrhea: NEGATIVE
Trichomonas: NEGATIVE

## 2019-01-23 LAB — URINE CULTURE

## 2019-01-25 LAB — T PALLIDUM SCREENING CASCADE: T pallidum Antibodies (TP-PA): REACTIVE — AB

## 2019-01-25 LAB — RPR TITER (REFLEX): RPR: NONREACTIVE

## 2019-01-25 LAB — PSA: Prostate Specific Ag, Serum: 0.8 ng/mL (ref 0.0–4.0)

## 2019-01-25 LAB — HIV ANTIBODY (ROUTINE TESTING W REFLEX): HIV Screen 4th Generation wRfx: NONREACTIVE

## 2019-01-25 LAB — T PALLIDUM ANTIBODY, EIA: T pallidum Antibody, EIA: POSITIVE — AB

## 2019-02-03 ENCOUNTER — Other Ambulatory Visit: Payer: Self-pay | Admitting: Internal Medicine

## 2019-02-15 ENCOUNTER — Other Ambulatory Visit: Payer: Self-pay

## 2019-02-15 ENCOUNTER — Ambulatory Visit (INDEPENDENT_AMBULATORY_CARE_PROVIDER_SITE_OTHER): Payer: 59 | Admitting: Neurology

## 2019-02-15 ENCOUNTER — Telehealth: Payer: Self-pay

## 2019-02-15 ENCOUNTER — Encounter: Payer: Self-pay | Admitting: Neurology

## 2019-02-15 VITALS — BP 142/84 | HR 76 | Temp 97.5°F | Ht 73.0 in | Wt 362.0 lb

## 2019-02-15 DIAGNOSIS — G250 Essential tremor: Secondary | ICD-10-CM | POA: Diagnosis not present

## 2019-02-15 NOTE — Patient Instructions (Signed)
You have a tremor of both hands, Right side more noticeable.  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  I do not see any signs or symptoms of parkinson's like disease or what we call parkinsonism.   For your tremor, I would not recommend any new medication for now and Suggest that we follow him clinically.  Please follow-up routinely in 1 year, sooner if you feel like you need to be seen.   Please remember, that any kind of tremor may be exacerbated by anxiety, anger, nervousness, excitement, dehydration, thyroid disease, certain medications, sleep deprivation, by caffeine, and low blood sugar values or blood sugar fluctuations.

## 2019-02-15 NOTE — Progress Notes (Signed)
Subjective:    Patient ID: Taiyo Kozma is a 44 y.o. male.  HPI     Star Age, MD, PhD [facility] [address] [place], [place] [address]  Dear Dr. Baird Cancer, I saw your patient, Kalven Ganim, upon your kind request in my neurologic clinic today for initial consultation of his tremors.  The patient is unaccompanied today.  Of note, he missed an appointment on 12/09/2018.  As you know, Mr. Can is a 44 year old right-handed gentleman with an underlying medical history of Vitamin D deficiency, hypertension, hyperlipidemia, low testosterone, palpitations, obstructive sleep apnea and morbid obesity with a BMI of over 40, who reports a several year history of hand tremors, probably around 5 years, more noticeable in the past 6 to 7 months.  Tremor is at times noticeable, it does fluctuate a little bit but he has not noticed any telltale triggers or alleviating factors.  That he has sometimes trouble with fine motor skills or feeding himself.  Handwriting is at times affected.  He reports a family history of tremor in his mother.  He has 1 brother who does not have a tremor.  He does not have a family history of Parkinson's disease.  The patient is single and his mother lives with him.  He drinks no caffeine on a daily basis, maybe coffee once a week and noncaffeine Sampey tea.  Typically no sodas.  He tries to hydrate well with water, estimates that he drinks about a gallon of water per day.  He drinks alcohol rarely.  He reports using a CPAP machine for his sleep apnea.  He has been on vitamin D.  I reviewed your office note from 10/27/2018.  He had blood work in your office including vitamin D level which was low at 23.7.  A1c was 5.3.  Previously, his TSH on 06/23/2018 was normal at 2.39.  He denies any numbness or tingling or weakness.  He denies any head or neck or voice tremor.  He has no lower extremity trembling.  He has not noticed any problems  with his balance.  He works from home, has done so for the past 2 years, works for [employer].  His Past Medical History Is Significant For: Past Medical History:  Diagnosis Date  . High cholesterol   . Hypertension   . Low testosterone   . Palpitations   . Sleep apnea     His Past Surgical History Is Significant For: No past surgical history on file.  His Family History Is Significant For: Family History  Problem Relation Age of Onset  . Hyperlipidemia Mother   . Hypertension Mother   . Diabetes Mother   . Hyperlipidemia Father   . Hypertension Father   . Diabetes Father   . Colon cancer Other   . Stomach cancer Other   . Heart Problems Other   . Diabetes Other   . Congenital heart disease Other     His Social History Is Significant For: Social History   Socioeconomic History  . Marital status: Single    Spouse name: Not on file  . Number of children: Not on file  . Years of education: Not on file  . Highest education level: Not on file  Occupational History  . Not on file  Tobacco Use  . Smoking status: Never Smoker  . Smokeless tobacco: Never Used  Substance and Sexual Activity  . Alcohol use: Yes    Comment: socially   .  Drug use: Never  . Sexual activity: Not on file  Other Topics Concern  . Not on file  Social History Narrative   ** Merged History Encounter **       Social Determinants of Health   Financial Resource Strain:   . Difficulty of Paying Living Expenses: Not on file  Food Insecurity:   . Worried About Programme researcher, broadcasting/film/video in the Last Year: Not on file  . Ran Out of Food in the Last Year: Not on file  Transportation Needs:   . Lack of Transportation (Medical): Not on file  . Lack of Transportation (Non-Medical): Not on file  Physical Activity:   . Days of Exercise per Week: Not on file  . Minutes of Exercise per Session: Not on file  Stress:   . Feeling of Stress : Not on file  Social Connections:   . Frequency of  Communication with Friends and Family: Not on file  . Frequency of Social Gatherings with Friends and Family: Not on file  . Attends Religious Services: Not on file  . Active Member of Clubs or Organizations: Not on file  . Attends Banker Meetings: Not on file  . Marital Status: Not on file    His Allergies Are:  No Known Allergies:   His Current Medications Are:  Outpatient Encounter Medications as of 02/15/2019  Medication Sig  . aspirin 81 MG tablet Take 81 mg by mouth daily.  Marland Kitchen BYSTOLIC 10 MG tablet TAKE 1 TABLET BY MOUTH EVERY DAY  . cetirizine (ZYRTEC) 10 MG tablet Take 10 mg by mouth daily.  Marland Kitchen diltiazem (CARDIZEM) 30 MG tablet TAKE 1 TABLET EVERY 4 HOURS AS NEEDED FOR AFIB HEART RATE OVER 100  . Multiple Vitamins-Minerals (MULTIVITAMIN ADULTS PO) Take by mouth.  . pantoprazole (PROTONIX) 40 MG tablet TAKE 1 TABLET BY MOUTH EVERY DAY  . rosuvastatin (CRESTOR) 20 MG tablet TAKE 1 TABLET BY MOUTH EVERY DAY  . SYNTHROID 50 MCG tablet TAKE 1 TABLET BY MOUTH EVERY DAY (Patient taking differently: TAKE 1 TABLET BY Monday - FRIDAY)  . SYNTHROID 75 MCG tablet Take 1 tablet by mouth on Saturday and Sunday  . testosterone enanthate (DELATESTRYL) 200 MG/ML injection Inject into the muscle every 14 (fourteen) days. For IM use only  . Vitamin D, Ergocalciferol, (DRISDOL) 1.25 MG (50000 UT) CAPS capsule TAKE ONE CAPSULE TWICE WEEKLY ON TUESDAYS AND FRIDAYS   No facility-administered encounter medications on file as of 02/15/2019.  : Review of Systems:  Out of a complete 14 point review of systems, all are reviewed and negative with the exception of these symptoms as listed below: Review of Systems  Neurological:       Pt presents today due to having bilateral hand shakiness. This has been going on for years but progressively has worsened. This happens daily.     Objective:  Neurological Exam  Physical Exam Physical Examination:   Vitals:   02/15/19 0946  BP: (!) 142/84   Pulse: 76  Temp: (!) 97.5 F (36.4 C)    General Examination: The patient is a very pleasant 44 y.o. male in no acute distress. He appears well-developed and well-nourished and well groomed.   HEENT: Normocephalic, atraumatic, pupils are equal, round and reactive to light and accommodation. Corrective eyeglasses in place.  Hearing is grossly intact. Face is symmetric with normal facial animation and normal facial sensation. Speech is clear with no dysarthria noted. There is no hypophonia. There is no lip, neck/head,  jaw or voice tremor. Neck is supple with full range of passive and active motion. There are no carotid bruits on auscultation. Oropharynx exam reveals: mild mouth dryness, adequate dental hygiene. Tongue protrudes centrally and palate elevates symmetrically.  Chest: Clear to auscultation without wheezing, rhonchi or crackles noted.  Heart: S1+S2+0, regular and normal without murmurs, rubs or gallops noted.   Abdomen: Soft, non-tender and non-distended with normal bowel sounds appreciated on auscultation.  Extremities: There is no pitting edema in the distal lower extremities bilaterally. Pedal pulses are intact.  Skin: Warm and dry without trophic changes noted.  Musculoskeletal: exam reveals no obvious joint deformities, tenderness or joint swelling or erythema.   Neurologically:  Mental status: The patient is awake, alert and oriented in all 4 spheres. His immediate and remote memory, attention, language skills and fund of knowledge are appropriate. There is no evidence of aphasia, agnosia, apraxia or anomia. Speech is clear with normal prosody and enunciation. Thought process is linear. Mood is normal and affect is normal.  Cranial nerves II - XII are as described above under HEENT exam. In addition: shoulder shrug is normal with equal shoulder height noted. Motor exam: Normal bulk, strength and tone is noted. There is no drift, or rebound. He has no resting tremor.  He has a  mild right upper extremity and minimal left upper extremity postural and action tremor.  He has no intention tremor. On 02/15/2019: On Archimedes spiral drawing he has very slight trembling with the right hand, mild trembling with the left hand, handwriting with the R hand is legible, not tremulous, not particularly micrographic.   Romberg is negative. Reflexes are 1+. Fine motor skills and coordination: intact with normal finger taps, normal hand movements, normal rapid alternating patting, normal foot taps and normal foot agility.  Cerebellar testing: No dysmetria or intention tremor on finger to nose testing. Heel to shin is unremarkable bilaterally. There is no truncal or gait ataxia.  Sensory exam: intact to light touch, vibration, temperature sense in the upper and lower extremities.  Gait, station and balance: He stands easily. No veering to one side is noted. No leaning to one side is noted. Posture is age-appropriate and stance is narrow based. Gait shows normal stride length and normal pace. No problems turning are noted. Tandem walk is unremarkable. Intact toe and heel stance is noted.               Assessment and Plan:   In summary, Jakolby Dijanti Liller is a very pleasant 44 y.o.-year old male with an underlying medical history of Vitamin D deficiency, hypertension, hyperlipidemia, low testosterone, palpitations, obstructive sleep apnea and morbid obesity with a BMI of over 40, who Presents for evaluation of his hand tremors.  He reports a several year history of tremors affecting the right more than left hand. His history and examination and family history are in keeping with mild essential tremor.  Findings are on the mild side and he is largely reassured.  No signs of parkinsonism.  He is advised that there are some symptomatic medications we can utilize if needed.  We mutually agreed to hold off on any new medications.  He had recent blood work which also checked his thyroid function.  He is  advised to stay well-hydrated, well rested, monitor his symptoms and follow-up in 1 year for recheck.  He is encouraged to call for any interim questions or for a sooner appointment if he desires. We talked about tremor triggers. We could  potentially utilize Mysoline and low for future symptomatic treatment or propranolol. I answered all his questions today and he was in agreement with the plan.His neurological exam is otherwise nonfocal.  Thank you very much for allowing me to participate in the care of this nice patient. If I can be of any further assistance to you please do not hesitate to call me at [phone].  Sincerely,   Huston Foley, MD, PhD

## 2019-02-15 NOTE — Telephone Encounter (Signed)
The pt was notified that his insurance on longer covers the bystolic that it's a plan exclusion and that samples are available for him to pickup.

## 2019-02-26 ENCOUNTER — Ambulatory Visit (HOSPITAL_COMMUNITY)
Admission: RE | Admit: 2019-02-26 | Discharge: 2019-02-26 | Disposition: A | Discharge status: Home / Self Care | Payer: 59 | Source: Ambulatory Visit | Attending: Nurse Practitioner | Admitting: Nurse Practitioner

## 2019-02-26 ENCOUNTER — Other Ambulatory Visit: Payer: Self-pay

## 2019-02-26 DIAGNOSIS — I119 Hypertensive heart disease without heart failure: Secondary | ICD-10-CM | POA: Diagnosis not present

## 2019-02-26 DIAGNOSIS — G4730 Sleep apnea, unspecified: Secondary | ICD-10-CM | POA: Insufficient documentation

## 2019-02-26 DIAGNOSIS — I480 Paroxysmal atrial fibrillation: Secondary | ICD-10-CM | POA: Diagnosis present

## 2019-02-26 DIAGNOSIS — I059 Rheumatic mitral valve disease, unspecified: Secondary | ICD-10-CM | POA: Diagnosis not present

## 2019-02-26 NOTE — Progress Notes (Signed)
  Echocardiogram 2D Echocardiogram has been performed.  Delcie Roch 02/26/2019, 3:48 PM

## 2019-03-01 ENCOUNTER — Encounter (HOSPITAL_COMMUNITY): Payer: Self-pay | Admitting: *Deleted

## 2019-03-02 ENCOUNTER — Encounter: Payer: Self-pay | Admitting: Internal Medicine

## 2019-03-02 ENCOUNTER — Ambulatory Visit (INDEPENDENT_AMBULATORY_CARE_PROVIDER_SITE_OTHER): Payer: 59 | Admitting: Internal Medicine

## 2019-03-02 ENCOUNTER — Other Ambulatory Visit: Payer: Self-pay

## 2019-03-02 VITALS — BP 114/76 | HR 77 | Temp 98.3°F | Ht 73.0 in | Wt 364.4 lb

## 2019-03-02 DIAGNOSIS — E291 Testicular hypofunction: Secondary | ICD-10-CM

## 2019-03-02 DIAGNOSIS — I10 Essential (primary) hypertension: Secondary | ICD-10-CM | POA: Diagnosis not present

## 2019-03-02 DIAGNOSIS — E039 Hypothyroidism, unspecified: Secondary | ICD-10-CM

## 2019-03-02 DIAGNOSIS — Z79899 Other long term (current) drug therapy: Secondary | ICD-10-CM

## 2019-03-02 DIAGNOSIS — Z6841 Body Mass Index (BMI) 40.0 and over, adult: Secondary | ICD-10-CM

## 2019-03-02 MED ORDER — ROSUVASTATIN CALCIUM 20 MG PO TABS: 20.0000 mg | ORAL_TABLET | Freq: Every day | ORAL | 8 refills | Status: DC

## 2019-03-02 MED ORDER — PANTOPRAZOLE SODIUM 40 MG PO TBEC: 40.0000 mg | DELAYED_RELEASE_TABLET | Freq: Every day | ORAL | 5 refills | Status: DC

## 2019-03-02 NOTE — Patient Instructions (Signed)
Hypothyroidism  Hypothyroidism is when the thyroid gland does not make enough of certain hormones (it is underactive). The thyroid gland is a small gland located in the lower front part of the neck, just in front of the windpipe (trachea). This gland makes hormones that help control how the body uses food for energy (metabolism) as well as how the heart and brain function. These hormones also play a role in keeping your bones strong. When the thyroid is underactive, it produces too little of the hormones thyroxine (T4) and triiodothyronine (T3). What are the causes? This condition may be caused by:  Hashimoto's disease. This is a disease in which the body's disease-fighting system (immune system) attacks the thyroid gland. This is the most common cause.  Viral infections.  Pregnancy.  Certain medicines.  Birth defects.  Past radiation treatments to the head or neck for cancer.  Past treatment with radioactive iodine.  Past exposure to radiation in the environment.  Past surgical removal of part or all of the thyroid.  Problems with a gland in the center of the brain (pituitary gland).  Lack of enough iodine in the diet. What increases the risk? You are more likely to develop this condition if:  You are male.  You have a family history of thyroid conditions.  You use a medicine called lithium.  You take medicines that affect the immune system (immunosuppressants). What are the signs or symptoms? Symptoms of this condition include:  Feeling as though you have no energy (lethargy).  Not being able to tolerate cold.  Weight gain that is not explained by a change in diet or exercise habits.  Lack of appetite.  Dry skin.  Coarse hair.  Menstrual irregularity.  Slowing of thought processes.  Constipation.  Sadness or depression. How is this diagnosed? This condition may be diagnosed based on:  Your symptoms, your medical history, and a physical exam.  Blood  tests. You may also have imaging tests, such as an ultrasound or MRI. How is this treated? This condition is treated with medicine that replaces the thyroid hormones that your body does not make. After you begin treatment, it may take several weeks for symptoms to go away. Follow these instructions at home:  Take over-the-counter and prescription medicines only as told by your health care provider.  If you start taking any new medicines, tell your health care provider.  Keep all follow-up visits as told by your health care provider. This is important. ? As your condition improves, your dosage of thyroid hormone medicine may change. ? You will need to have blood tests regularly so that your health care provider can monitor your condition. Contact a health care provider if:  Your symptoms do not get better with treatment.  You are taking thyroid replacement medicine and you: ? Sweat a lot. ? Have tremors. ? Feel anxious. ? Lose weight rapidly. ? Cannot tolerate heat. ? Have emotional swings. ? Have diarrhea. ? Feel weak. Get help right away if you have:  Chest pain.  An irregular heartbeat.  A rapid heartbeat.  Difficulty breathing. Summary  Hypothyroidism is when the thyroid gland does not make enough of certain hormones (it is underactive).  When the thyroid is underactive, it produces too little of the hormones thyroxine (T4) and triiodothyronine (T3).  The most common cause is Hashimoto's disease, a disease in which the body's disease-fighting system (immune system) attacks the thyroid gland. The condition can also be caused by viral infections, medicine, pregnancy, or past   radiation treatment to the head or neck.  Symptoms may include weight gain, dry skin, constipation, feeling as though you do not have energy, and not being able to tolerate cold.  This condition is treated with medicine to replace the thyroid hormones that your body does not make. This information  is not intended to replace advice given to you by your health care provider. Make sure you discuss any questions you have with your health care provider. Document Revised: 01/10/2017 Document Reviewed: 01/08/2017 Elsevier Patient Education  2020 Elsevier Inc.  

## 2019-03-02 NOTE — Progress Notes (Signed)
This visit occurred during the SARS-CoV-2 public health emergency.  Safety protocols were in place, including screening questions prior to the visit, additional usage of staff PPE, and extensive cleaning of exam room while observing appropriate contact time as indicated for disinfecting solutions.  Subjective:     Patient ID: Noah Shepherd , male    DOB: March 26, 1975 , 44 y.o.   MRN: [id number]   Chief Complaint  Patient presents with  . Hypertension    HPI  Hypertension This is a chronic problem. The current episode started more than 1 year ago. The problem has been gradually improving since onset. The problem is controlled. Pertinent negatives include no blurred vision, chest pain, palpitations or shortness of breath. Risk factors for coronary artery disease include obesity.     Past Medical History:  Diagnosis Date  . High cholesterol   . Hypertension   . Low testosterone   . Palpitations   . Sleep apnea      Family History  Problem Relation Age of Onset  . Hyperlipidemia Mother   . Hypertension Mother   . Diabetes Mother   . Hyperlipidemia Father   . Hypertension Father   . Diabetes Father   . Colon cancer Other   . Stomach cancer Other   . Heart Problems Other   . Diabetes Other   . Congenital heart disease Other      Current Outpatient Medications:  .  aspirin 81 MG tablet, Take 81 mg by mouth daily., Disp: , Rfl:  .  BYSTOLIC 10 MG tablet, TAKE 1 TABLET BY MOUTH EVERY DAY, Disp: 30 tablet, Rfl: 8 .  cetirizine (ZYRTEC) 10 MG tablet, Take 10 mg by mouth daily., Disp: , Rfl:  .  diltiazem (CARDIZEM) 30 MG tablet, TAKE 1 TABLET EVERY 4 HOURS AS NEEDED FOR AFIB HEART RATE OVER 100, Disp: 45 tablet, Rfl: 1 .  Multiple Vitamins-Minerals (MULTIVITAMIN ADULTS PO), Take by mouth., Disp: , Rfl:  .  pantoprazole (PROTONIX) 40 MG tablet, Take 1 tablet (40 mg total) by mouth daily., Disp: 30 tablet, Rfl: 5 .  rosuvastatin (CRESTOR) 20 MG tablet, Take 1 tablet (20 mg  total) by mouth daily., Disp: 30 tablet, Rfl: 8 .  SYNTHROID 50 MCG tablet, TAKE 1 TABLET BY MOUTH EVERY DAY (Patient taking differently: TAKE 1 TABLET BY Monday - FRIDAY), Disp: 31 tablet, Rfl: 9 .  SYNTHROID 75 MCG tablet, Take 1 tablet by mouth on Saturday and Sunday, Disp: 90 tablet, Rfl: 1 .  testosterone enanthate (DELATESTRYL) 200 MG/ML injection, Inject into the muscle every 14 (fourteen) days. For IM use only, Disp: , Rfl:  .  Vitamin D, Ergocalciferol, (DRISDOL) 1.25 MG (50000 UT) CAPS capsule, TAKE ONE CAPSULE TWICE WEEKLY ON TUESDAYS AND FRIDAYS, Disp: 24 capsule, Rfl: 1   No Known Allergies   Review of Systems  Constitutional: Negative.   Eyes: Negative for blurred vision.  Respiratory: Negative.  Negative for shortness of breath.   Cardiovascular: Negative.  Negative for chest pain and palpitations.  Gastrointestinal: Negative.   Neurological: Negative.   Psychiatric/Behavioral: Negative.      Today's Vitals   03/02/19 1511  BP: 114/76  Pulse: 77  Temp: 98.3 F (36.8 C)  TempSrc: Oral  Weight: (!) 364 lb 6.4 oz (165.3 kg)  Height: '6\' 1"'$  (1.854 m)   Body mass index is 48.08 kg/m.   Objective:  Physical Exam Vitals and nursing note reviewed.  Constitutional:      Appearance: Normal appearance. He is  obese.  Cardiovascular:     Rate and Rhythm: Normal rate and regular rhythm.     Heart sounds: Normal heart sounds.  Pulmonary:     Effort: Pulmonary effort is normal.     Breath sounds: Normal breath sounds.  Skin:    General: Skin is warm.  Neurological:     General: No focal deficit present.     Mental Status: He is alert.  Psychiatric:        Mood and Affect: Mood normal.         Assessment And Plan:     1. Essential hypertension  Chronic, well controlled.  He will continue with current meds. He is encouraged to avoid adding salt to his foods.   2. Primary hypothyroidism  I will check thyroid panel and adjust meds as needed.   3. Hypogonadism  male  Chronic, he is now seen by Urology once yearly. I will check testosterone levels today along with CBC and LFTs.   4. Class 3 severe obesity due to excess calories with serious comorbidity and body mass index (BMI) of 45.0 to 49.9 in adult [facility])  He is encouraged to strive for BMI less than 40 to decrease cardiac risk. He is encouraged to exercise no less than 30 minutes five days per week.   5. Drug therapy  - Testosterone,Free and Total - CBC no Diff - CMP14+EGFR    Maximino Greenland, MD    THE PATIENT IS ENCOURAGED TO PRACTICE SOCIAL DISTANCING DUE TO THE COVID-19 PANDEMIC.

## 2019-03-03 LAB — SPECIMEN STATUS REPORT

## 2019-03-04 ENCOUNTER — Other Ambulatory Visit: Payer: Self-pay

## 2019-03-04 ENCOUNTER — Telehealth: Payer: Self-pay

## 2019-03-04 DIAGNOSIS — E039 Hypothyroidism, unspecified: Secondary | ICD-10-CM

## 2019-03-04 NOTE — Telephone Encounter (Signed)
Left vm for pt to return call. Also sent mychart message

## 2019-03-04 NOTE — Telephone Encounter (Signed)
I left the pt a message to call the office back.  I was calling the pt to schedule a lab appt so that he can have his tsh and t4 free levels checked.

## 2019-03-05 LAB — CMP14+EGFR
ALT: 27 IU/L (ref 0–44)
AST: 31 IU/L (ref 0–40)
Albumin/Globulin Ratio: 1.6 (ref 1.2–2.2)
Albumin: 4.7 g/dL (ref 4.0–5.0)
Alkaline Phosphatase: 68 IU/L (ref 39–117)
BUN/Creatinine Ratio: 9 (ref 9–20)
BUN: 11 mg/dL (ref 6–24)
Bilirubin Total: 1.3 mg/dL — ABNORMAL HIGH (ref 0.0–1.2)
CO2: 22 mmol/L (ref 20–29)
Calcium: 9.6 mg/dL (ref 8.7–10.2)
Chloride: 102 mmol/L (ref 96–106)
Creatinine, Ser: 1.2 mg/dL (ref 0.76–1.27)
GFR calc Af Amer: 84 mL/min/{1.73_m2} (ref >59)
GFR calc non Af Amer: 73 mL/min/{1.73_m2} (ref >59)
Globulin, Total: 2.9 g/dL (ref 1.5–4.5)
Glucose: 80 mg/dL (ref 65–99)
Potassium: 4.1 mmol/L (ref 3.5–5.2)
Sodium: 140 mmol/L (ref 134–144)
Total Protein: 7.6 g/dL (ref 6.0–8.5)

## 2019-03-05 LAB — CBC
Hematocrit: 55.5 % — ABNORMAL HIGH (ref 37.5–51.0)
Hemoglobin: 18.8 g/dL — ABNORMAL HIGH (ref 13.0–17.7)
MCH: 30.4 pg (ref 26.6–33.0)
MCHC: 33.9 g/dL (ref 31.5–35.7)
MCV: 90 fL (ref 79–97)
Platelets: 207 10*3/uL (ref 150–450)
RBC: 6.18 x10E6/uL — ABNORMAL HIGH (ref 4.14–5.80)
RDW: 12.2 % (ref 11.6–15.4)
WBC: 6.9 10*3/uL (ref 3.4–10.8)

## 2019-03-05 LAB — TESTOSTERONE,FREE AND TOTAL
Testosterone, Free: 24.5 pg/mL — ABNORMAL HIGH (ref 6.8–21.5)
Testosterone: 961 ng/dL — ABNORMAL HIGH (ref 264–916)

## 2019-03-08 ENCOUNTER — Other Ambulatory Visit: Payer: 59

## 2019-03-08 ENCOUNTER — Other Ambulatory Visit: Payer: Self-pay

## 2019-03-08 ENCOUNTER — Other Ambulatory Visit: Payer: Self-pay | Admitting: Internal Medicine

## 2019-03-09 LAB — TSH: TSH: 2.22 u[IU]/mL (ref 0.450–4.500)

## 2019-03-09 LAB — T4, FREE: Free T4: 1.08 ng/dL (ref 0.82–1.77)

## 2019-03-14 IMAGING — CR DG CHEST 2V
2 series · 2 of 2 positions shown · non-contrast
Comparison: None.

CLINICAL DATA: Chest pain

EXAM:
CHEST - 2 VIEW

[w chest pa *]
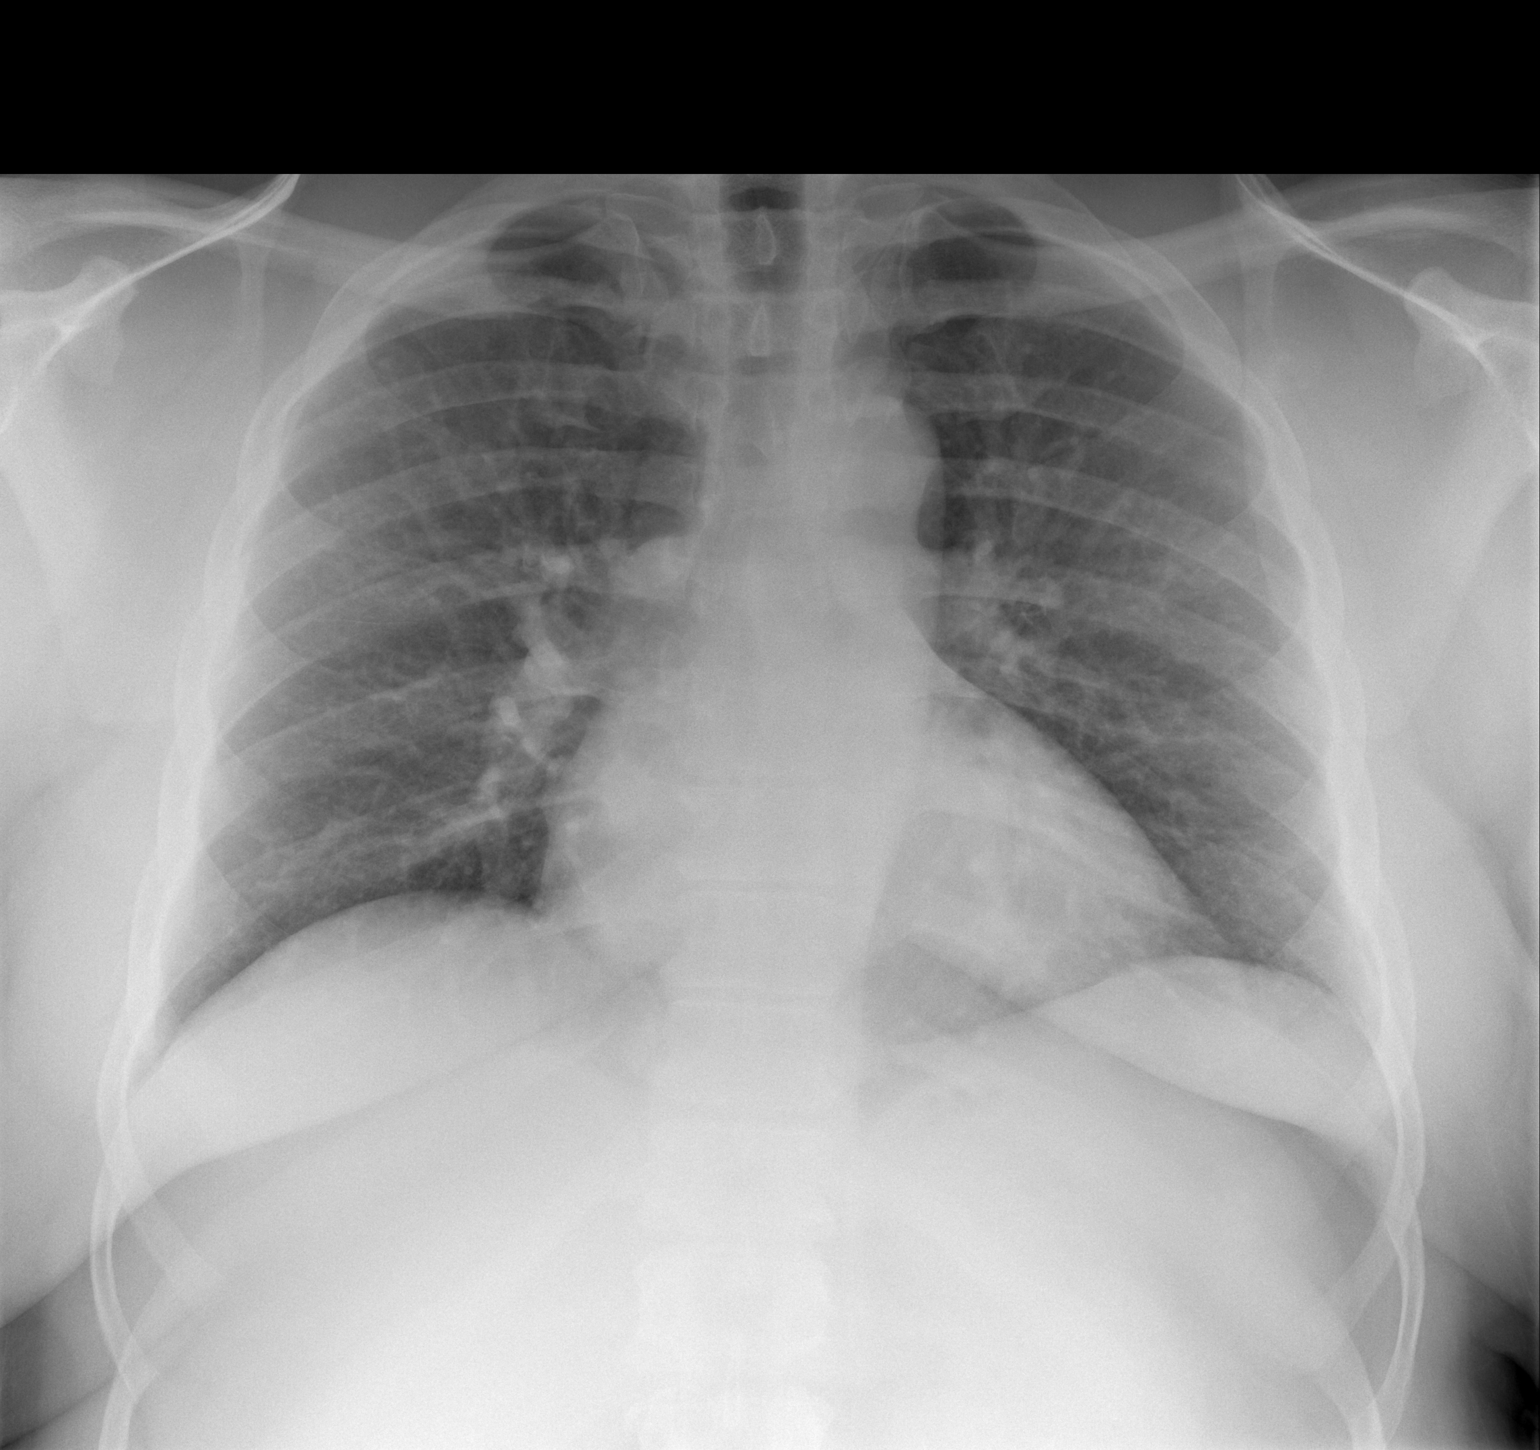

[w chest lat *]
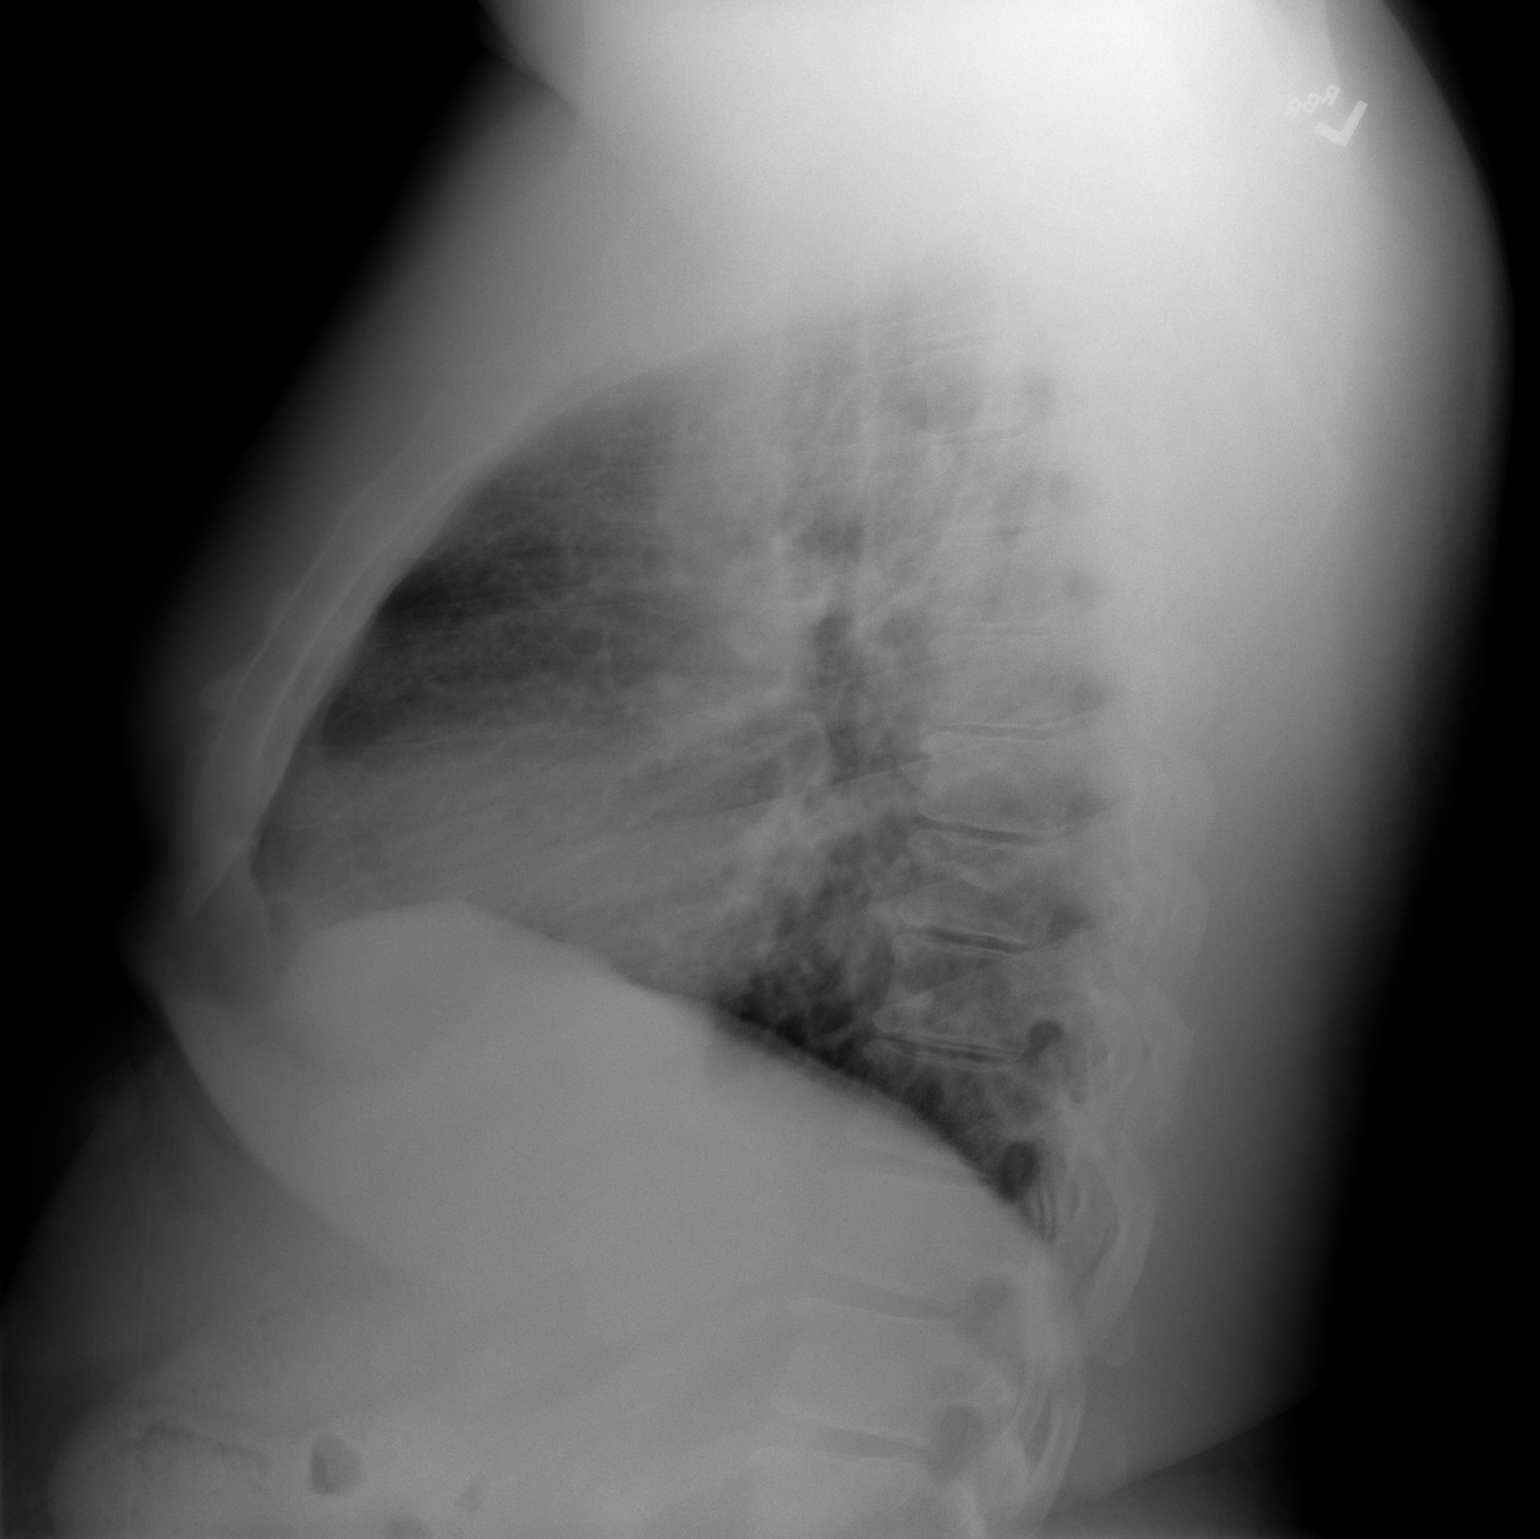

[2 of 2 positions shown; findings below may reference images not displayed]

FINDINGS: Normal heart size. Normal mediastinal contour. No pneumothorax. No
pleural effusion. Lungs appear clear, with no acute consolidative
airspace disease and no pulmonary edema.
IMPRESSION: No active cardiopulmonary disease.

## 2019-05-19 ENCOUNTER — Other Ambulatory Visit: Payer: Self-pay | Admitting: Internal Medicine

## 2019-05-21 ENCOUNTER — Encounter: Payer: Self-pay | Admitting: Internal Medicine

## 2019-05-27 ENCOUNTER — Other Ambulatory Visit (HOSPITAL_COMMUNITY)
Admission: RE | Admit: 2019-05-27 | Discharge: 2019-05-27 | Disposition: A | Discharge status: Home / Self Care | Payer: 59 | Source: Ambulatory Visit | Attending: Internal Medicine | Admitting: Internal Medicine

## 2019-05-27 ENCOUNTER — Ambulatory Visit (INDEPENDENT_AMBULATORY_CARE_PROVIDER_SITE_OTHER): Payer: 59 | Admitting: Internal Medicine

## 2019-05-27 ENCOUNTER — Encounter: Payer: Self-pay | Admitting: Internal Medicine

## 2019-05-27 ENCOUNTER — Other Ambulatory Visit: Payer: Self-pay

## 2019-05-27 VITALS — BP 124/76 | HR 75 | Temp 98.1°F | Ht 73.0 in | Wt 370.4 lb

## 2019-05-27 DIAGNOSIS — L989 Disorder of the skin and subcutaneous tissue, unspecified: Secondary | ICD-10-CM

## 2019-05-27 NOTE — Progress Notes (Signed)
This visit occurred during the SARS-CoV-2 public health emergency.  Safety protocols were in place, including screening questions prior to the visit, additional usage of staff PPE, and extensive cleaning of exam room while observing appropriate contact time as indicated for disinfecting solutions.  Subjective:     Patient ID: Noah Shepherd , male    DOB: Mar 12, 1975 , 44 y.o.   MRN: [id number]   Chief Complaint  Patient presents with  . Skin Tag    HPI Pt is here for skin lesion removal referred by Dr Allyne Gee. Had a couple on his R inner thigh, but used wart removal medication on one of them and fell off. He still has the one in inner R thigh.     Past Medical History:  Diagnosis Date  . High cholesterol   . Hypertension   . Low testosterone   . Palpitations   . Sleep apnea      Family History  Problem Relation Age of Onset  . Hyperlipidemia Mother   . Hypertension Mother   . Diabetes Mother   . Hyperlipidemia Father   . Hypertension Father   . Diabetes Father   . Colon cancer Other   . Stomach cancer Other   . Heart Problems Other   . Diabetes Other   . Congenital heart disease Other      Current Outpatient Medications:  .  aspirin 81 MG tablet, Take 81 mg by mouth daily., Disp: , Rfl:  .  BYSTOLIC 10 MG tablet, TAKE 1 TABLET BY MOUTH EVERY DAY, Disp: 30 tablet, Rfl: 8 .  cetirizine (ZYRTEC) 10 MG tablet, Take 10 mg by mouth daily., Disp: , Rfl:  .  diltiazem (CARDIZEM) 30 MG tablet, TAKE 1 TABLET EVERY 4 HOURS AS NEEDED FOR AFIB HEART RATE OVER 100, Disp: 45 tablet, Rfl: 1 .  Multiple Vitamins-Minerals (MULTIVITAMIN ADULTS PO), Take by mouth., Disp: , Rfl:  .  pantoprazole (PROTONIX) 40 MG tablet, Take 1 tablet (40 mg total) by mouth daily., Disp: 30 tablet, Rfl: 5 .  rosuvastatin (CRESTOR) 20 MG tablet, Take 1 tablet (20 mg total) by mouth daily., Disp: 30 tablet, Rfl: 8 .  SYNTHROID 50 MCG tablet, TAKE 1 TABLET BY MOUTH EVERY DAY (Patient taking differently:  TAKE 1 TABLET BY Monday - FRIDAY), Disp: 31 tablet, Rfl: 9 .  SYNTHROID 75 MCG tablet, Take 1 tablet by mouth on Saturday and Sunday, Disp: 90 tablet, Rfl: 1 .  testosterone enanthate (DELATESTRYL) 200 MG/ML injection, Inject into the muscle every 14 (fourteen) days. For IM use only, Disp: , Rfl:  .  Vitamin D, Ergocalciferol, (DRISDOL) 1.25 MG (50000 UT) CAPS capsule, TAKE ONE CAPSULE TWICE WEEKLY ON TUESDAYS AND FRIDAYS, Disp: 24 capsule, Rfl: 1   No Known Allergies   Review of Systems   Today's Vitals   05/27/19 1627  BP: 124/76  Pulse: 75  Temp: 98.1 F (36.7 C)  TempSrc: Oral  SpO2: 98%  Weight: (!) 370 lb 6.4 oz (168 kg)  Height: 6\' 1"  (1.854 m)   Body mass index is 48.87 kg/m.   Objective:  Physical Exam Vitals and nursing note reviewed.  Constitutional:      General: He is not in acute distress.    Appearance: He is obese.  HENT:     Head: Atraumatic.     Right Ear: External ear normal.     Left Ear: External ear normal.  Eyes:     Extraocular Movements: Extraocular movements intact.  Conjunctiva/sclera: Conjunctivae normal.  Pulmonary:     Effort: Pulmonary effort is normal.  Musculoskeletal:        General: Normal range of motion.     Cervical back: Neck supple.  Skin:    General: Skin is warm and dry.     Comments: R THIGH- 1x 1.3 cm skin lesion with wide base on inner R proximal thigh. The one on anterior thigh has hypopigmented skin color, and is well healed.   Neurological:     Mental Status: He is oriented to person, place, and time.     Gait: Gait normal.  Psychiatric:        Mood and Affect: Mood normal.        Behavior: Behavior normal.        Thought Content: Thought content normal.        Judgment: Judgment normal.    PROCEDURE Skin cleansed with alcohol, topical infiltration of lidocaine 1 % wit.... epi., area cleansed with betadine. Sterile technique used. Sterile # 15 blade used and I shaved the skin lesion off. Mild bleeding noted for  which I used silver nitrate to cauterize. I applied bacitracin ointment and dressing. Pt tolerated the procedure well.   Assessment And Plan:     1. Skin lesion- chronic. Wound care instructions reviewed. We will inform him when pathology report is back.  - Slide consult, pathology   Noah Advani RODRIGUEZ-SOUTHWORTH, PA-C    THE PATIENT IS ENCOURAGED TO PRACTICE SOCIAL DISTANCING DUE TO THE COVID-19 PANDEMIC.

## 2019-05-27 NOTE — Patient Instructions (Signed)
Keep dressing on for 24 hours, but Ok to change it if it falls off. If area starts bleeding apply pressure for 15 -20 min. Apply antibiotic ointment twice a day for 5 days Watch out for signs of infection like increased pain, swelling or redness. Go to Urgent care if this happens.    Excision of Skin Lesions, Care After This sheet gives you information about how to care for yourself after your procedure. Your health care provider may also give you more specific instructions. If you have problems or questions, contact your health care provider. What can I expect after the procedure? After your procedure, it is common to have pain or discomfort at the excision site. Follow these instructions at home: Excision care   Follow instructions from your health care provider about how to take care of your excision site. Make sure you: ? Wash your hands with soap and water before and after you change your bandage (dressing). If soap and water are not available, use hand sanitizer. ? Change your dressing as told by your health care provider. ? Leave stitches (sutures), skin glue, or adhesive strips in place. These skin closures may need to stay in place for 2 weeks or longer. If adhesive strip edges start to loosen and curl up, you may trim the loose edges. Do not remove adhesive strips completely unless your health care provider tells you to do that.  Check the excision area every day for signs of infection. Watch for: ? Redness, swelling, or pain. ? Fluid or blood. ? Warmth. ? Pus or a bad smell.  Keep the site clean, dry, and protected for at least 48 hours.  For bleeding, apply gentle but firm pressure to the area using a folded towel for 20 minutes.  Avoid high-impact exercise and activities until the sutures are removed or the area heals. General instructions  Take over-the-counter and prescription medicines only as told by your health care provider.  Follow instructions from your health  care provider about how to minimize scarring. Scarring should lessen over time.  Avoid sun exposure until the area has healed. Use sunscreen to protect the area from the sun after it has healed.  Keep all follow-up visits as told by your health care provider. This is important. Contact a health care provider if:  You have redness, swelling, or pain around your excision site.  You have fluid or blood coming from your excision site.  Your excision site feels warm to the touch.  You have pus or a bad smell coming from your excision site.  You have a fever.  You have pain that does not improve in 2-3 days after your procedure.  You notice skin irregularities or changes in how you feel (sensation). Summary  This sheet of instructions provides you with information about caring for yourself after your procedure. Contact your health care provider if you have any problems or questions.  Take over-the-counter and prescription medicines only as told by your health care provider.  Change your dressing as told by your health care provider.  Contact a health care provider if you have redness, swelling, pain, or other signs of infection around your excision site.  Keep all follow-up visits as told by your health care provider. This is important. This information is not intended to replace advice given to you by your health care provider. Make sure you discuss any questions you have with your health care provider. Document Revised: 08/06/2017 Document Reviewed: 08/06/2017 Elsevier Patient Education  2020 [employer]  

## 2019-05-31 NOTE — Addendum Note (Signed)
Addended by: Gean Birchwood R on: 05/31/2019 11:02 AM   Modules accepted: Orders

## 2019-06-01 LAB — SURGICAL PATHOLOGY

## 2019-06-10 ENCOUNTER — Other Ambulatory Visit: Payer: Self-pay | Admitting: Internal Medicine

## 2019-06-10 DIAGNOSIS — L280 Lichen simplex chronicus: Secondary | ICD-10-CM

## 2019-06-10 NOTE — Progress Notes (Signed)
Derm referral done

## 2019-07-14 ENCOUNTER — Encounter: Payer: Self-pay | Admitting: Internal Medicine

## 2019-07-14 ENCOUNTER — Other Ambulatory Visit: Payer: Self-pay | Admitting: Internal Medicine

## 2019-08-25 ENCOUNTER — Ambulatory Visit: Payer: 59 | Admitting: Dermatology

## 2019-09-24 ENCOUNTER — Other Ambulatory Visit: Payer: Self-pay | Admitting: Internal Medicine

## 2019-09-24 DIAGNOSIS — E039 Hypothyroidism, unspecified: Secondary | ICD-10-CM

## 2019-11-04 ENCOUNTER — Encounter: Payer: Self-pay | Admitting: Internal Medicine

## 2019-11-09 ENCOUNTER — Encounter: Payer: 59 | Admitting: Internal Medicine

## 2019-11-16 ENCOUNTER — Encounter: Payer: 59 | Admitting: Internal Medicine

## 2019-12-14 ENCOUNTER — Ambulatory Visit: Payer: 59 | Admitting: Dermatology

## 2019-12-28 ENCOUNTER — Encounter: Payer: Self-pay | Admitting: Internal Medicine

## 2020-01-05 ENCOUNTER — Other Ambulatory Visit: Payer: Self-pay

## 2020-01-05 ENCOUNTER — Encounter: Payer: Self-pay | Admitting: Nurse Practitioner

## 2020-01-05 ENCOUNTER — Ambulatory Visit (INDEPENDENT_AMBULATORY_CARE_PROVIDER_SITE_OTHER): Payer: 59 | Admitting: Nurse Practitioner

## 2020-01-05 VITALS — BP 138/86 | HR 97 | Temp 98.7°F | Ht 73.0 in | Wt 359.6 lb

## 2020-01-05 DIAGNOSIS — Z114 Encounter for screening for human immunodeficiency virus [HIV]: Secondary | ICD-10-CM

## 2020-01-05 DIAGNOSIS — Z1159 Encounter for screening for other viral diseases: Secondary | ICD-10-CM

## 2020-01-05 DIAGNOSIS — Z23 Encounter for immunization: Secondary | ICD-10-CM

## 2020-01-05 DIAGNOSIS — E039 Hypothyroidism, unspecified: Secondary | ICD-10-CM

## 2020-01-05 DIAGNOSIS — I10 Essential (primary) hypertension: Secondary | ICD-10-CM | POA: Diagnosis not present

## 2020-01-05 DIAGNOSIS — Z6841 Body Mass Index (BMI) 40.0 and over, adult: Secondary | ICD-10-CM

## 2020-01-05 MED ORDER — SYNTHROID 75 MCG PO TABS: ORAL_TABLET | ORAL | 1 refills | Status: DC

## 2020-01-05 MED ORDER — PANTOPRAZOLE SODIUM 40 MG PO TBEC: 40.0000 mg | DELAYED_RELEASE_TABLET | Freq: Every day | ORAL | 1 refills | Status: AC

## 2020-01-05 MED ORDER — LEVOTHYROXINE SODIUM 50 MCG PO TABS: ORAL_TABLET | ORAL | 1 refills | Status: AC

## 2020-01-05 MED ORDER — ROSUVASTATIN CALCIUM 20 MG PO TABS
20.0000 mg | ORAL_TABLET | Freq: Every day | ORAL | 1 refills | Status: DC
Start: 2020-01-05 — End: 2020-01-10

## 2020-01-05 MED ORDER — NEBIVOLOL HCL 10 MG PO TABS: 10.0000 mg | ORAL_TABLET | Freq: Every day | ORAL | 1 refills | Status: DC

## 2020-01-05 MED ORDER — DILTIAZEM HCL 30 MG PO TABS: ORAL_TABLET | ORAL | 1 refills | Status: DC

## 2020-01-05 MED ORDER — VITAMIN D (ERGOCALCIFEROL) 1.25 MG (50000 UNIT) PO CAPS: ORAL_CAPSULE | ORAL | 1 refills | Status: DC

## 2020-01-05 NOTE — Patient Instructions (Signed)

## 2020-01-05 NOTE — Progress Notes (Signed)
I,Tianna Badgett,acting as a Neurosurgeon for [employer], NP.,have documented all relevant documentation on the behalf of [employer], NP,as directed by  Charlesetta Ivory, NP while in the presence of Charlesetta Ivory, NP.  This visit occurred during the SARS-CoV-2 public health emergency.  Safety protocols were in place, including screening questions prior to the visit, additional usage of staff PPE, and extensive cleaning of exam room while observing appropriate contact time as indicated for disinfecting solutions.  Subjective:     Patient ID: Noah Shepherd , male    DOB: [birth year] , 44 y.o.   MRN: [id number]   Chief Complaint  Patient presents with  . Hypertension    HPI  Patient is here for hypertension follow up. He has not had his medication in 2 weeks. He has moved to [place] and states that this will be his last appointment at this office.    He has not been eating better or follows any diet. But he will try harder. Will try to exercise more as well.   Wt Readings from Last 3 Encounters: 01/05/20 : (!) 359 lb 9.6 oz (163.1 kg) 05/27/19 : (!) 370 lb 6.4 oz (168 kg) 03/02/19 : (!) 364 lb 6.4 oz (165.3 kg)   Hypertension This is a chronic problem. The current episode started more than 1 year ago. The problem has been gradually improving since onset. The problem is controlled. Pertinent negatives include no blurred vision, chest pain, palpitations or shortness of breath. Risk factors for coronary artery disease include obesity. Past treatments include calcium channel blockers. Compliance problems include exercise and diet.      Past Medical History:  Diagnosis Date  . High cholesterol   . Hypertension   . Low testosterone   . Palpitations   . Sleep apnea      Family History  Problem Relation Age of Onset  . Hyperlipidemia Mother   . Hypertension Mother   . Diabetes Mother   . Hyperlipidemia Father   . Hypertension Father   . Diabetes Father   .  Colon cancer Other   . Stomach cancer Other   . Heart Problems Other   . Diabetes Other   . Congenital heart disease Other      Current Outpatient Medications:  .  aspirin 81 MG tablet, Take 81 mg by mouth daily., Disp: , Rfl:  .  cetirizine (ZYRTEC) 10 MG tablet, Take 10 mg by mouth daily., Disp: , Rfl:  .  Multiple Vitamins-Minerals (MULTIVITAMIN ADULTS PO), Take by mouth., Disp: , Rfl:  .  testosterone enanthate (DELATESTRYL) 200 MG/ML injection, Inject into the muscle every 14 (fourteen) days. For IM use only, Disp: , Rfl:  .  diltiazem (CARDIZEM) 30 MG tablet, Take one tablet every 4 hours as needed for AFIB heart rate over 100, Disp: 45 tablet, Rfl: 1 .  levothyroxine (SYNTHROID) 50 MCG tablet, TAKE 1 TABLET BY Monday - FRIDAY, Disp: 90 tablet, Rfl: 1 .  nebivolol (BYSTOLIC) 10 MG tablet, Take 1 tablet (10 mg total) by mouth daily., Disp: 90 tablet, Rfl: 1 .  pantoprazole (PROTONIX) 40 MG tablet, Take 1 tablet (40 mg total) by mouth daily., Disp: 90 tablet, Rfl: 1 .  rosuvastatin (CRESTOR) 20 MG tablet, Take 1 tablet (20 mg total) by mouth daily., Disp: 90 tablet, Rfl: 1 .  SYNTHROID 75 MCG tablet, Take 1 tablet by mouth on Saturday and Sunday, Disp: 90 tablet, Rfl: 1 .  Vitamin D, Ergocalciferol, (DRISDOL) 1.25 MG (50000 UNIT) CAPS capsule,  TAKE ONE CAPSULE BY MOUTH TWICE WEEKLY ON TUESDAYS AND FRIDAYS, Disp: 24 capsule, Rfl: 1   No Known Allergies   Review of Systems  Constitutional: Negative.  Negative for appetite change and fatigue.  Eyes: Negative for blurred vision.  Respiratory: Negative.  Negative for cough and shortness of breath.   Cardiovascular: Negative.  Negative for chest pain and palpitations.  Gastrointestinal: Negative.   Endocrine: Negative for cold intolerance and heat intolerance.  Neurological: Negative.      Today's Vitals   01/05/20 1149  BP: 138/86  Pulse: 97  Temp: 98.7 F (37.1 C)  TempSrc: Oral  Weight: (!) 359 lb 9.6 oz (163.1 kg)  Height:  6\' 1"  (1.854 m)   Body mass index is 47.44 kg/m.  Wt Readings from Last 3 Encounters:  01/05/20 (!) 359 lb 9.6 oz (163.1 kg)  05/27/19 (!) 370 lb 6.4 oz (168 kg)  03/02/19 (!) 364 lb 6.4 oz (165.3 kg)     Objective:  Physical Exam Constitutional:      Appearance: Normal appearance. He is obese.  Cardiovascular:     Rate and Rhythm: Normal rate and regular rhythm.     Pulses: Normal pulses.     Heart sounds: Normal heart sounds. No murmur heard.   Pulmonary:     Effort: Pulmonary effort is normal.     Breath sounds: Normal breath sounds. No wheezing or rales.  Neurological:     Mental Status: He is alert.  Psychiatric:        Mood and Affect: Mood normal.        Behavior: Behavior normal.        Thought Content: Thought content normal.        Judgment: Judgment normal.         Assessment And Plan:     1. Essential hypertension -Controlled and chronic with medication. -Refilled cardizem, bystolic  -Educated patient about medication compliance, decrease intake of salt and exercise 4-5 days a week for at least 30 min.  -Avoid red meats and processed foods.   2. Primary hypothyroidism -Refiled synthroid 75 mcg  -Will get labs on his yearly physical exam.   3. Need for influenza vaccination - Received low dose flu vaccine today in office.   4. Class 3 severe obesity due to excess calories with serious comorbidity and body mass index (BMI) of 45.0 to 49.9 in adult [facility]) -Advised exercise 4-5 days a week for at least 30 min.  -Weight bearing and aerobic exercise recommended. -Avoid red meats, saturated fats, fatty foods. -Avoid fast food and increase water intake.   5. Encounter for screening for HIV - HIV antibody (with reflex) -Will review and notify patient of result   6. Encounter for hepatitis C screening test for low risk patient - Hepatitis C antibody -Will review and notify patient of result.    He is encouraged to strive for BMI less than 30 to decrease  cardiac risk. Advised to aim for at least 150 minutes of exercise per week.  Patient is moving to [place] and will establish care there.    Patient was given opportunity to ask questions. Patient verbalized understanding of the plan and was able to repeat key elements of the plan. All questions were answered to their satisfaction.  [place], NP   I, Charlesetta Ivory, NP, have reviewed all documentation for this visit. The documentation on 01/05/20 for the exam, diagnosis, procedures, and orders are all accurate and complete.  THE PATIENT IS  ENCOURAGED TO PRACTICE SOCIAL DISTANCING DUE TO THE COVID-19 PANDEMIC.   

## 2020-01-06 LAB — HEPATITIS C ANTIBODY: Hep C Virus Ab: <0.1 s/co ratio (ref 0.0–0.9)

## 2020-01-06 LAB — HIV ANTIBODY (ROUTINE TESTING W REFLEX): HIV Screen 4th Generation wRfx: NONREACTIVE

## 2020-01-10 ENCOUNTER — Other Ambulatory Visit: Payer: Self-pay

## 2020-01-10 DIAGNOSIS — E039 Hypothyroidism, unspecified: Secondary | ICD-10-CM

## 2020-01-10 MED ORDER — DILTIAZEM HCL 30 MG PO TABS: ORAL_TABLET | ORAL | 1 refills | Status: AC

## 2020-01-10 MED ORDER — VITAMIN D (ERGOCALCIFEROL) 1.25 MG (50000 UNIT) PO CAPS: ORAL_CAPSULE | ORAL | 1 refills | Status: AC

## 2020-01-10 MED ORDER — ROSUVASTATIN CALCIUM 20 MG PO TABS: 20.0000 mg | ORAL_TABLET | Freq: Every day | ORAL | 1 refills | Status: AC

## 2020-01-10 MED ORDER — SYNTHROID 75 MCG PO TABS: ORAL_TABLET | ORAL | 1 refills | Status: AC

## 2020-01-10 MED ORDER — NEBIVOLOL HCL 10 MG PO TABS: 10.0000 mg | ORAL_TABLET | Freq: Every day | ORAL | 1 refills | Status: AC

## 2020-02-16 ENCOUNTER — Ambulatory Visit: Payer: 59 | Admitting: Neurology

## 2020-04-06 ENCOUNTER — Other Ambulatory Visit: Payer: Self-pay | Admitting: Nurse Practitioner

## 2020-12-12 ENCOUNTER — Other Ambulatory Visit: Payer: Self-pay | Admitting: Nurse Practitioner
